# Patient Record
Sex: Female | Born: 1990 | Race: White | Hispanic: No | Marital: Single | Smoking: Never smoker
Health system: Southern US, Community
[De-identification: ages and names within clinical notes are randomized; demographics above are authoritative.]

## PROBLEM LIST (undated history)

## (undated) DIAGNOSIS — J45909 Unspecified asthma, uncomplicated: Secondary | ICD-10-CM

## (undated) DIAGNOSIS — N200 Calculus of kidney: Secondary | ICD-10-CM

## (undated) DIAGNOSIS — L509 Urticaria, unspecified: Secondary | ICD-10-CM

## (undated) DIAGNOSIS — K589 Irritable bowel syndrome without diarrhea: Secondary | ICD-10-CM

## (undated) DIAGNOSIS — G2581 Restless legs syndrome: Secondary | ICD-10-CM

## (undated) DIAGNOSIS — K219 Gastro-esophageal reflux disease without esophagitis: Secondary | ICD-10-CM

## (undated) DIAGNOSIS — Z87442 Personal history of urinary calculi: Secondary | ICD-10-CM

## (undated) DIAGNOSIS — E039 Hypothyroidism, unspecified: Secondary | ICD-10-CM

## (undated) DIAGNOSIS — J019 Acute sinusitis, unspecified: Secondary | ICD-10-CM

## (undated) DIAGNOSIS — R000 Tachycardia, unspecified: Secondary | ICD-10-CM

## (undated) HISTORY — PX: CHOLECYSTECTOMY: SHX55

## (undated) HISTORY — PX: TONSILLECTOMY: SUR1361

---
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
## 2011-05-19 ENCOUNTER — Ambulatory Visit: Payer: Self-pay

## 2011-10-26 ENCOUNTER — Observation Stay: Payer: Self-pay

## 2011-10-26 LAB — URINALYSIS, COMPLETE
Bacteria: NONE SEEN
Glucose,UR: NEGATIVE mg/dL (ref 0–75)
Ketone: NEGATIVE
Nitrite: NEGATIVE
Ph: 7 (ref 4.5–8.0)
RBC,UR: 2155 /HPF (ref 0–5)
Squamous Epithelial: 20
WBC UR: 10 /HPF (ref 0–5)

## 2011-11-28 ENCOUNTER — Observation Stay: Payer: Self-pay | Admitting: Obstetrics & Gynecology

## 2011-11-29 LAB — URINALYSIS, COMPLETE
Bacteria: NONE SEEN
Bilirubin,UR: NEGATIVE
Blood: NEGATIVE
Glucose,UR: NEGATIVE mg/dL (ref 0–75)
Ketone: NEGATIVE
Leukocyte Esterase: NEGATIVE
Nitrite: NEGATIVE
Ph: 6 (ref 4.5–8.0)
Protein: NEGATIVE
RBC,UR: NONE SEEN /HPF (ref 0–5)
Specific Gravity: 1.021 (ref 1.003–1.030)
Squamous Epithelial: 11
WBC UR: NONE SEEN /HPF (ref 0–5)

## 2012-02-01 ENCOUNTER — Observation Stay: Payer: Self-pay

## 2012-02-02 ENCOUNTER — Observation Stay: Payer: Self-pay

## 2012-02-09 ENCOUNTER — Observation Stay: Payer: Self-pay | Admitting: Obstetrics & Gynecology

## 2012-02-28 ENCOUNTER — Observation Stay: Payer: Self-pay | Admitting: Obstetrics and Gynecology

## 2012-03-09 ENCOUNTER — Inpatient Hospital Stay: Payer: Self-pay | Admitting: Obstetrics & Gynecology

## 2012-03-09 LAB — CBC WITH DIFFERENTIAL/PLATELET
Basophil #: 0 10*3/uL (ref 0.0–0.1)
Basophil %: 0.2 %
Eosinophil #: 0.1 10*3/uL (ref 0.0–0.7)
HGB: 9.3 g/dL — ABNORMAL LOW (ref 12.0–16.0)
MCH: 25.3 pg — ABNORMAL LOW (ref 26.0–34.0)
MCHC: 31.5 g/dL — ABNORMAL LOW (ref 32.0–36.0)
Neutrophil %: 68.3 %
Platelet: 190 10*3/uL (ref 150–440)
WBC: 8.4 10*3/uL (ref 3.6–11.0)

## 2012-07-13 ENCOUNTER — Emergency Department: Payer: Self-pay | Admitting: Emergency Medicine

## 2013-01-06 ENCOUNTER — Ambulatory Visit: Payer: Self-pay | Admitting: Family Medicine

## 2013-03-19 ENCOUNTER — Other Ambulatory Visit: Payer: Self-pay | Admitting: Gastroenterology

## 2013-03-19 LAB — CLOSTRIDIUM DIFFICILE BY PCR

## 2013-03-21 LAB — STOOL CULTURE

## 2014-05-30 ENCOUNTER — Ambulatory Visit: Payer: Self-pay | Admitting: Physician Assistant

## 2014-05-30 LAB — RAPID STREP-A WITH REFLX: Micro Text Report: NEGATIVE

## 2014-06-02 LAB — BETA STREP CULTURE(ARMC)

## 2014-11-20 NOTE — H&P (Signed)
L&D Evaluation:  History Expanded:   HPI 24 yo G1 at 25+ weeks presents with c/o right sided pain intermittantly for several weeks.  Improves at times w stretching.  Tylenol provides min help.  Denies contractions, LOF, VB, +FM. [facility]  [facility] for early [facility] to care.    Patient's Medical History No Chronic Illness    Patient's Surgical History none    Medications Pre Natal Vitamins    Allergies NKDA    Social History none    Family History Non-Contributory   Exam:   Vital Signs stable    General no apparent distress    Mental Status clear    Abdomen gravid, non-tender    Estimated Fetal Weight Average for gestational age    Back no CVAT    Edema no edema    FHT FHR 130s    Ucx absent    Skin dry   Impression:   Impression ABD PAIN, related to pregnancy, appears to be musculoskeletal in origin.   Plan:   Plan UA    Comments Analgesics.  Min narcotic use encouraged and allowed.  Percocet here helped, Rx for 12. F/U office.   Electronic Signatures: Letitia LibraHarris, Amaani Guilbault Paul (MD)  (Signed 19-May-13 08:00)  Authored: L&D Evaluation   Last Updated: 19-May-13 08:00 by Letitia LibraHarris, Nishtha Raider Paul (MD)

## 2014-11-20 NOTE — H&P (Signed)
L&D Evaluation:  History Expanded:   HPI Monitoring in office weekly due to recent vag bleeding.  Otherwise Fetal Well-being Reassuring throughoujt recent visits.  Today Non-Stress Test not reassuring so sent to Labor and Delivery. Good FM.  No complaints.No vaginal bleeding or ROM.  Currently 35 weeks.    Patient's Medical History No Chronic Illness    Patient's Surgical History none    Medications Pre Natal Vitamins    Allergies NKDA    Social History none    Family History Non-Contributory   ROS:   ROS All systems were reviewed.  HEENT, CNS, GI, GU, Respiratory, CV, Renal and Musculoskeletal systems were found to be normal.   Exam:   Vital Signs stable    General no apparent distress    Mental Status clear    Abdomen gravid, non-tender    Estimated Fetal Weight Average for gestational age    FHT normal rate with no decels, Non-Stress Test REACTIVE    Fetal Heart Rate 140    Ucx absent   Impression:   Impression Non-Stress Test R, Monitoring due to earlier nonreassuring Non-Stress Test.  No signs of labor.   Plan:   Plan EFM/NST    Comments F/U in office this week.   Electronic Signatures: Letitia LibraHarris, Reed Dady Paul (MD)  (Signed 30-Jul-13 22:38)  Authored: L&D Evaluation   Last Updated: 30-Jul-13 22:38 by Letitia LibraHarris, Kaysha Parsell Paul (MD)

## 2014-11-20 NOTE — H&P (Signed)
L&D Evaluation:  History Expanded:   HPI 24 yo G1 at 20 weeks 3 days presents with c/o left sided pain after eating last night. Denies contractions, LOF, VB, +FM. [facility]  [facility] for early [facility] to care, incomplete anatomy screen.    Patient's Medical History No Chronic Illness    Patient's Surgical History none    Medications Pre Natal Vitamins    Allergies NKDA    Social History none   Exam:   Vital Signs stable    General no apparent distress    Mental Status clear    Abdomen gravid, non-tender    Back CVAT, left side only    Edema no edema    FHT FHR    Ucx regular    Skin dry   Plan:   Plan UA    Comments UA: 3+ blood, 30 protein Renal US WNL. Pt feels much better after IV fluid bolus and 2 Percocet.  D/c home, will f/u with urine cx results prn.   Electronic Signatures: Jamear Carbonneau, Marta Lamasamara K (CNM)  (Signed 16-Apr-13 08:47)  Authored: L&D Evaluation   Last Updated: 16-Apr-13 08:47 by Vella KohlerBrothers, Nielle Duford K (CNM)

## 2016-06-07 ENCOUNTER — Ambulatory Visit: Payer: Medicaid Other

## 2016-06-07 ENCOUNTER — Encounter: Payer: Self-pay | Admitting: Emergency Medicine

## 2016-06-07 ENCOUNTER — Ambulatory Visit
Admission: EM | Admit: 2016-06-07 | Discharge: 2016-06-07 | Disposition: A | Discharge status: Home / Self Care | Payer: Medicaid Other | Attending: Emergency Medicine | Admitting: Emergency Medicine

## 2016-06-07 DIAGNOSIS — M25571 Pain in right ankle and joints of right foot: Secondary | ICD-10-CM | POA: Insufficient documentation

## 2016-06-07 DIAGNOSIS — R937 Abnormal findings on diagnostic imaging of other parts of musculoskeletal system: Secondary | ICD-10-CM | POA: Diagnosis not present

## 2016-06-07 DIAGNOSIS — S93601A Unspecified sprain of right foot, initial encounter: Secondary | ICD-10-CM

## 2016-06-07 DIAGNOSIS — M79671 Pain in right foot: Secondary | ICD-10-CM | POA: Diagnosis present

## 2016-06-07 HISTORY — DX: Urticaria, unspecified: L50.9

## 2016-06-07 HISTORY — DX: Acute sinusitis, unspecified: J01.90

## 2016-06-07 HISTORY — DX: Irritable bowel syndrome, unspecified: K58.9

## 2016-06-07 MED ORDER — NAPROXEN 500 MG PO TABS: 500.0000 mg | ORAL_TABLET | Freq: Two times a day (BID) | ORAL | 0 refills | Status: DC

## 2016-06-07 NOTE — ED Provider Notes (Signed)
CSN: [id number]     Arrival date & time 06/07/16  1221 History   First MD Initiated Contact with Patient 06/07/16 1420     Chief Complaint  Patient presents with  . Foot Pain   (Consider location/radiation/quality/duration/timing/severity/associated sxs/prior Treatment) HPI  Is a 25 year old female who presents with right foot pain that is lasted about 4 days. She was standing from a seated position when she felt a pop over her  lateral aspect of the right ankle and foot along with the pain. Since that time ,she is been able to walk but is very painful and she is limping badly.Pain is mostly  on the lateral aspect the ankle and over the dorsum of her foot. There is swelling present and some redness but no erythema. She states any dorsiflexion seems to bother her the most.Other than when she stood andheard the pop she does not remember any specific injury prior to that and has not changed her activities. Been using ibuprofen 800 mg without any relief of her pain.      Past Medical History:  Diagnosis Date  . Acute sinusitis   . Hives   . IBS (irritable bowel syndrome)    Past Surgical History:  Procedure Laterality Date  . CHOLECYSTECTOMY    . TONSILLECTOMY     Family History  Problem Relation Age of Onset  . Healthy Mother   . Healthy Father    Social History  Substance Use Topics  . Smoking status: Never Smoker  . Smokeless tobacco: Never Used  . Alcohol use Not on file   OB History    Gravida Para Term Preterm AB Living   1             SAB TAB Ectopic Multiple Live Births                 Review of Systems  Constitutional: Positive for activity change. Negative for chills, fatigue and fever.  Musculoskeletal: Positive for arthralgias and gait problem.  All other systems reviewed and are negative.   Allergies  Patient has no known allergies.  Home Medications   Prior to Admission medications   Medication Sig Start Date End Date Taking? Authorizing Provider   cetirizine (ZYRTEC) 10 MG tablet Take 10 mg by mouth daily.   Yes Historical Provider, MD  famotidine (PEPCID) 40 MG tablet Take 40 mg by mouth daily.   Yes Historical Provider, MD  ibuprofen (ADVIL,MOTRIN) 800 MG tablet Take 800 mg by mouth every 8 (eight) hours as needed.   Yes Historical Provider, MD  norethindrone-ethinyl estradiol (JUNEL FE,GILDESS FE,LOESTRIN FE) 1-20 MG-MCG tablet Take 1 tablet by mouth daily.   Yes Historical Provider, MD  naproxen (NAPROSYN) 500 MG tablet Take 1 tablet (500 mg total) by mouth 2 (two) times daily with a meal. 06/07/16   Lutricia Feil, PA-C   Meds Ordered and Administered this Visit  Medications - No data to display  Ht 5\' 4"  (1.626 m)   Wt 200 lb (90.7 kg)   LMP 06/05/2016 (Approximate)   BMI 34.33 kg/m  No data found.   Physical Exam  Constitutional: She is oriented to person, place, and time. She appears well-developed and well-nourished. No distress.  HENT:  Head: Normocephalic and atraumatic.  Eyes: EOM are normal. Pupils are equal, round, and reactive to light.  Neck: Normal range of motion. Neck supple.  Musculoskeletal: She exhibits edema and tenderness.  Examination of the right ankle and foot shows some swelling over  the dorsum of foot and also over the lateral malleolar area. Patient is reluctant to dorsiflex or plantar flex due to pain but subtalar motion is intact. There is no pain with compression of the tibia fibula in the syndesmotic area. Is no tenderness over the medial malleolus or along the deltoid ligament. There is tenderness over the anterior fibilo talar ligament area and also the proximal foot from the fourth and third metatarsals. She's also has tenderness distally over the distal metatarsals at the metatarsal necks of 5 through 2.  Neurological: She is alert and oriented to person, place, and time.  Skin: Skin is warm and dry. She is not diaphoretic.  Psychiatric: She has a normal mood and affect. Her behavior is  normal. Judgment and thought content normal.  Nursing note and vitals reviewed.   Urgent Care Course   Clinical Course     Procedures (including critical care time)  Labs Review Labs Reviewed - No data to display  Imaging Review Dg Ankle Complete Right  Result Date: 06/07/2016 CLINICAL DATA:  25 year old female with acute onset foot an ankle pain 4 days ago. No specific injury. Initial encounter. EXAM: RIGHT ANKLE - COMPLETE 3+ VIEW COMPARISON:  None. FINDINGS: Bone mineralization is within normal limits. Normal mortise joint alignment. Talar dome intact. No joint effusion. Distal tibia and fibula appear normal. Calcaneus is intact. Negative visualized right foot. IMPRESSION: Normal radiographic appearance of the right ankle. Electronically Signed   By: Odessa FlemingH  Hall M.D.   On: 06/07/2016 14:52   Dg Foot Complete Right  Result Date: 06/07/2016 CLINICAL DATA:  25 year old female with acute onset foot an ankle pain 4 days ago. No specific injury. Initial encounter. EXAM: RIGHT FOOT COMPLETE - 3+ VIEW COMPARISON:  Right ankle series from today reported separately. FINDINGS: Bone mineralization is within normal limits. Joint spaces and alignment are normal. Right foot osseous structures are intact. No acute osseous abnormality identified. Suggestion of mild generalized soft tissue swelling. No subcutaneous gas. IMPRESSION: Mild generalized soft tissue swelling suspected. No osseous abnormality identified in the right foot. Electronically Signed   By: Odessa FlemingH  Hall M.D.   On: 06/07/2016 14:53     Visual Acuity Review  Right Eye Distance:   Left Eye Distance:   Bilateral Distance:    Right Eye Near:   Left Eye Near:    Bilateral Near:         MDM   1. Sprain of right foot, initial encounter    New Prescriptions   NAPROXEN (NAPROSYN) 500 MG TABLET    Take 1 tablet (500 mg total) by mouth 2 (two) times daily with a meal.  Plan: 1. Test/x-ray results and diagnosis reviewed with patient 2.  rx as per orders; risks, benefits, potential side effects reviewed with patient 3. Recommend supportive treatment with Elevation sufficient to control swelling and ice 20 minutes out of every 2 hours for pain and swelling. Patient was given a boot orthosis to help facilitate quicker recovery her to walk without crutches. May remove the polypoid quiet times to allow her skin to rest. If she is not improving she should follow-up with her primary care physician or podiatrist. She should not take her ibuprofen while she is taking Naprosyn. I've cautioned her always take the anti-inflammatory medications with food and to consider using Prevacid or Prilosec for additional gastric protection 4. F/u prn if symptoms worsen or don't improve     Lutricia FeilWilliam P Shenay Torti, PA-C 06/07/16 1517    Lutricia FeilWilliam P Yared Barefoot,  PA-C 06/07/16 1520

## 2016-06-07 NOTE — ED Triage Notes (Signed)
Per patient c/o right foot pain. Patient stated swelling and burning at right foot. Per patient no injury to her right foot.

## 2016-10-07 ENCOUNTER — Ambulatory Visit (INDEPENDENT_AMBULATORY_CARE_PROVIDER_SITE_OTHER): Payer: Medicaid Other | Admitting: Podiatry

## 2016-10-07 ENCOUNTER — Ambulatory Visit (INDEPENDENT_AMBULATORY_CARE_PROVIDER_SITE_OTHER): Payer: Medicaid Other

## 2016-10-07 VITALS — BP 119/94 | HR 113 | Resp 16

## 2016-10-07 DIAGNOSIS — S93621A Sprain of tarsometatarsal ligament of right foot, initial encounter: Secondary | ICD-10-CM | POA: Diagnosis not present

## 2016-10-07 DIAGNOSIS — S93601A Unspecified sprain of right foot, initial encounter: Secondary | ICD-10-CM

## 2016-10-07 DIAGNOSIS — M79671 Pain in right foot: Secondary | ICD-10-CM

## 2016-10-08 NOTE — Progress Notes (Signed)
Brittany Stevens presents today with chief complaint of pain to the right foot 4 months she reports no injury but she states she stepped down 4 months ago and felt a pop in the lateral aspect of her foot. She went to urgent care and was treated for sprain she states that the foot continues to her hip pops when she walks and appears to be flattening. She's been treated with a cam boot and naproxen to no avail. She states the boot did help some.  Objective: Vital signs are stable she is alert and oriented 3. Pulses are palpable. Neurologic sensorium is intact. Deep tendon reflexes are intact. Muscle strength +5 over 5 dorsiflexors plantar flexors and inverters and everters all intrinsic musculature is intact. Orthopedic evaluation and straight solid joints distal to the ankle level full range of motion without crepitation. She does have pain on from playing range of motion of Lisfranc's joints. She also has been on palpation of the fourth fifth metatarsocuboid articulation of the dorsal ridge noted. This is ask almost like a dislocation. She also has tenderness on and hypermobility at the level of the second metatarsal intermediate cuneiform joint. Radiographs demonstrate gapping between the first and second metatarsals and the medial and intermediate cuneiforms. This appears to be a Lisfranc's injury.  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  Plan: At this point fill that since therapy has not provided her with relief wearing a Cam Walker and proximal fill MRI will be necessary. I will follow up with her once his results right.

## 2016-10-14 ENCOUNTER — Telehealth: Payer: Self-pay

## 2016-10-14 NOTE — Telephone Encounter (Signed)
MRI has been approved per [facility])  Auth # [id number]  Expires 11/07/16

## 2016-10-22 ENCOUNTER — Ambulatory Visit
Admission: RE | Admit: 2016-10-22 | Discharge: 2016-10-22 | Disposition: A | Discharge status: Home / Self Care | Payer: Medicaid Other | Source: Ambulatory Visit | Attending: Podiatry | Admitting: Podiatry

## 2016-10-22 ENCOUNTER — Telehealth: Payer: Self-pay | Admitting: *Deleted

## 2016-10-22 DIAGNOSIS — S93621A Sprain of tarsometatarsal ligament of right foot, initial encounter: Secondary | ICD-10-CM

## 2016-10-22 DIAGNOSIS — M79671 Pain in right foot: Secondary | ICD-10-CM | POA: Diagnosis not present

## 2016-10-22 NOTE — Telephone Encounter (Addendum)
-----   Message from Elinor Parkinson, [place] sent at 10/22/2016  5:01 PM EDT ----- Send for an over read and inform patient of delay. I informed pt of Dr. Geryl Rankins orders and explained the delay and process of Overread service.10/27/2016-Mailed copy of MRI disc to [facility].

## 2016-11-03 ENCOUNTER — Encounter: Payer: Self-pay | Admitting: Podiatry

## 2016-11-11 ENCOUNTER — Ambulatory Visit (INDEPENDENT_AMBULATORY_CARE_PROVIDER_SITE_OTHER): Payer: Medicaid Other | Admitting: Podiatry

## 2016-11-11 DIAGNOSIS — S93601D Unspecified sprain of right foot, subsequent encounter: Secondary | ICD-10-CM | POA: Diagnosis not present

## 2016-11-11 DIAGNOSIS — S93621D Sprain of tarsometatarsal ligament of right foot, subsequent encounter: Secondary | ICD-10-CM

## 2016-11-12 NOTE — Progress Notes (Signed)
She presents today for follow-up of right foot pain. She had an MRI performed recently to confirm that there was no fracture or dislocation of Lisfranc joints. She states that seems to be doing better.  Objective: Vital signs are stable she is alert and oriented 3. MRI does indicate a sprain of the ligaments surrounding some of the joints and Lisfranc. Particularly overlying the third metatarsal cuneiform region.  Assessment: Sprained right foot.  Plan: Follow up with us as needed. Instructed her that she's not improved in 6384 months to call us.

## 2017-01-06 ENCOUNTER — Encounter: Payer: Self-pay | Admitting: Emergency Medicine

## 2017-01-06 DIAGNOSIS — Z79899 Other long term (current) drug therapy: Secondary | ICD-10-CM | POA: Diagnosis not present

## 2017-01-06 DIAGNOSIS — R112 Nausea with vomiting, unspecified: Secondary | ICD-10-CM | POA: Insufficient documentation

## 2017-01-06 DIAGNOSIS — R109 Unspecified abdominal pain: Secondary | ICD-10-CM | POA: Diagnosis present

## 2017-01-06 DIAGNOSIS — R1013 Epigastric pain: Secondary | ICD-10-CM | POA: Insufficient documentation

## 2017-01-06 LAB — URINALYSIS, COMPLETE (UACMP) WITH MICROSCOPIC
BILIRUBIN URINE: NEGATIVE
Glucose, UA: NEGATIVE mg/dL
HGB URINE DIPSTICK: NEGATIVE
KETONES UR: NEGATIVE mg/dL
Nitrite: NEGATIVE
PROTEIN: 30 mg/dL — AB
Specific Gravity, Urine: 1.027 (ref 1.005–1.030)
pH: 5 (ref 5.0–8.0)

## 2017-01-06 LAB — COMPREHENSIVE METABOLIC PANEL
ALBUMIN: 3.9 g/dL (ref 3.5–5.0)
ALT: 13 U/L — ABNORMAL LOW (ref 14–54)
ANION GAP: 6 (ref 5–15)
AST: 17 U/L (ref 15–41)
Alkaline Phosphatase: 85 U/L (ref 38–126)
BUN: 10 mg/dL (ref 6–20)
CHLORIDE: 109 mmol/L (ref 101–111)
CO2: 23 mmol/L (ref 22–32)
Calcium: 9 mg/dL (ref 8.9–10.3)
Creatinine, Ser: 0.97 mg/dL (ref 0.44–1.00)
GFR calc Af Amer: >60 mL/min (ref >60)
Glucose, Bld: 123 mg/dL — ABNORMAL HIGH (ref 65–99)
POTASSIUM: 4 mmol/L (ref 3.5–5.1)
Sodium: 138 mmol/L (ref 135–145)
Total Bilirubin: 0.3 mg/dL (ref 0.3–1.2)
Total Protein: 8.2 g/dL — ABNORMAL HIGH (ref 6.5–8.1)

## 2017-01-06 LAB — CBC
HEMATOCRIT: 41.2 % (ref 35.0–47.0)
HEMOGLOBIN: 13.9 g/dL (ref 12.0–16.0)
MCH: 27.5 pg (ref 26.0–34.0)
MCHC: 33.7 g/dL (ref 32.0–36.0)
MCV: 81.7 fL (ref 80.0–100.0)
Platelets: 360 10*3/uL (ref 150–440)
RBC: 5.04 MIL/uL (ref 3.80–5.20)
RDW: 15.5 % — ABNORMAL HIGH (ref 11.5–14.5)
WBC: 15.4 10*3/uL — AB (ref 3.6–11.0)

## 2017-01-06 LAB — LIPASE, BLOOD: LIPASE: 36 U/L (ref 11–51)

## 2017-01-06 NOTE — ED Triage Notes (Signed)
Patient ambulatory to triage with steady gait, without difficulty or distress noted; pt reports mid abd pain with N/V for last 6 hrs; st hx pancreatitis

## 2017-01-07 ENCOUNTER — Emergency Department
Admission: EM | Admit: 2017-01-07 | Discharge: 2017-01-07 | Disposition: A | Discharge status: Home / Self Care | Payer: Medicaid Other | Attending: Emergency Medicine | Admitting: Emergency Medicine

## 2017-01-07 DIAGNOSIS — R1013 Epigastric pain: Secondary | ICD-10-CM

## 2017-01-07 DIAGNOSIS — N390 Urinary tract infection, site not specified: Secondary | ICD-10-CM

## 2017-01-07 DIAGNOSIS — R112 Nausea with vomiting, unspecified: Secondary | ICD-10-CM

## 2017-01-07 LAB — POCT PREGNANCY, URINE: PREG TEST UR: NEGATIVE

## 2017-01-07 LAB — PREGNANCY, URINE: Preg Test, Ur: NEGATIVE

## 2017-01-07 MED ORDER — DICYCLOMINE HCL 20 MG PO TABS: 20.0000 mg | ORAL_TABLET | Freq: Four times a day (QID) | ORAL | 0 refills | Status: DC | PRN

## 2017-01-07 MED ORDER — ONDANSETRON HCL 4 MG/2ML IJ SOLN
4.0000 mg | Freq: Once | INTRAMUSCULAR | Status: AC
  Administered 2017-01-07: 4 mg via INTRAVENOUS
  Filled 2017-01-07: qty 2

## 2017-01-07 MED ORDER — ONDANSETRON 4 MG PO TBDP: 4.0000 mg | ORAL_TABLET | Freq: Three times a day (TID) | ORAL | 0 refills | Status: AC | PRN

## 2017-01-07 MED ORDER — DEXTROSE 5 % IV SOLN
1.0000 g | INTRAVENOUS | Status: DC
  Administered 2017-01-07: 03:00:00 via INTRAVENOUS
  Filled 2017-01-07: qty 10

## 2017-01-07 MED ORDER — MORPHINE SULFATE (PF) 4 MG/ML IV SOLN
4.0000 mg | Freq: Once | INTRAVENOUS | Status: AC
  Administered 2017-01-07: 4 mg via INTRAVENOUS
  Filled 2017-01-07: qty 1

## 2017-01-07 MED ORDER — CEPHALEXIN 500 MG PO CAPS: 500.0000 mg | ORAL_CAPSULE | Freq: Three times a day (TID) | ORAL | 0 refills | Status: DC

## 2017-01-07 MED ORDER — FAMOTIDINE IN NACL 20-0.9 MG/50ML-% IV SOLN
20.0000 mg | Freq: Once | INTRAVENOUS | Status: AC
  Administered 2017-01-07: 20 mg via INTRAVENOUS
  Filled 2017-01-07: qty 50

## 2017-01-07 MED ORDER — SODIUM CHLORIDE 0.9 % IV BOLUS (SEPSIS)
1000.0000 mL | Freq: Once | INTRAVENOUS | Status: AC
  Administered 2017-01-07: 1000 mL via INTRAVENOUS

## 2017-01-07 NOTE — ED Provider Notes (Signed)
[facility] Emergency Department Provider Note   ____________________________________________   First MD Initiated Contact with Patient 01/07/17 0300     (approximate)  I have reviewed the triage vital signs and the nursing notes.   HISTORY  Chief Complaint Abdominal Pain    HPI Brittany Stevens is a 26 y.o. female who presents to the ED from home with chief complaint of abdominal pain, nausea and vomiting. Reports a history of pancreatitis status post cholecystectomy when she was 26 years old. States onset of epigastric abdominal pain approximately 4 PM after eating an egg sandwich. She was able to eat dinner but began to vomit 3. Denies associated fever, chills, chest pain, shortness of breath, diarrhea. Denies recent travel, camping, antibiotic use or trauma. Nothing makes her symptoms better or worse.   Past Medical History:  Diagnosis Date  . Acute sinusitis   . Hives   . IBS (irritable bowel syndrome)     There are no active problems to display for this patient.   Past Surgical History:  Procedure Laterality Date  . CHOLECYSTECTOMY    . TONSILLECTOMY      Prior to Admission medications   Medication Sig Start Date End Date Taking? Authorizing Provider  cetirizine (ZYRTEC) 10 MG tablet Take 10 mg by mouth daily.    [provider]  famotidine (PEPCID) 40 MG tablet Take 40 mg by mouth daily.    [provider]  naproxen (NAPROSYN) 500 MG tablet Take 1 tablet (500 mg total) by mouth 2 (two) times daily with a meal. 06/07/16   Lutricia Feiloemer, William P, PA-C    Allergies Patient has no known allergies.  Family History  Problem Relation Age of Onset  . Healthy Mother   . Healthy Father     Social History Social History  Substance Use Topics  . Smoking status: Never Smoker  . Smokeless tobacco: Never Used  . Alcohol use Not on file    Review of Systems  Constitutional: No fever/chills. Eyes: No visual changes. ENT:  No sore throat. Cardiovascular: Denies chest pain. Respiratory: Denies shortness of breath. Gastrointestinal: Positive for abdominal pain.  Positive for nausea and vomiting.  No diarrhea.  No constipation. Genitourinary: Negative for dysuria. Musculoskeletal: Negative for back pain. Skin: Negative for rash. Neurological: Negative for headaches, focal weakness or numbness.   ____________________________________________   PHYSICAL EXAM:  VITAL SIGNS: ED Triage Vitals  Enc Vitals Group     BP 01/06/17 2300 (!) 134/93     Pulse Rate 01/06/17 2300 100     Resp 01/06/17 2300 (!) 22     Temp 01/06/17 2300 97.9 F (36.6 C)     Temp Source 01/06/17 2300 Oral     SpO2 01/06/17 2300 100 %     Weight 01/06/17 2258 200 lb (90.7 kg)     Height 01/06/17 2258 5\' 4"  (1.626 m)     Head Circumference --      Peak Flow --      Pain Score 01/06/17 2257 10     Pain Loc --      Pain Edu? --      Excl. in GC? --     Constitutional: Alert and oriented. Well appearing and in no acute distress. Eyes: Conjunctivae are normal. PERRL. EOMI. Head: Atraumatic. Nose: No congestion/rhinnorhea. Mouth/Throat: Mucous membranes are mildly dry.  Oropharynx non-erythematous. Neck: No stridor.   Cardiovascular: Tachycardic rate, regular rhythm. Grossly normal heart sounds.  Good peripheral circulation. Respiratory: Normal  respiratory effort.  No retractions. Lungs CTAB. Gastrointestinal: Soft and mildly tender to palpation epigastrium without rebound or guarding. No distention. No abdominal bruits. No CVA tenderness. Musculoskeletal: No lower extremity tenderness nor edema.  No joint effusions. Neurologic:  Normal speech and language. No gross focal neurologic deficits are appreciated. No gait instability. Skin:  Skin is warm, dry and intact. No rash noted. Psychiatric: Mood and affect are normal. Speech and behavior are normal.  ____________________________________________   LABS (all labs ordered are  listed, but only abnormal results are displayed)  Labs Reviewed  COMPREHENSIVE METABOLIC PANEL - Abnormal; Notable for the following:       Result Value   Glucose, Bld 123 (*)    Total Protein 8.2 (*)    ALT 13 (*)    All other components within normal limits  CBC - Abnormal; Notable for the following:    WBC 15.4 (*)    RDW 15.5 (*)    All other components within normal limits  URINALYSIS, COMPLETE (UACMP) WITH MICROSCOPIC - Abnormal; Notable for the following:    Color, Urine YELLOW (*)    APPearance CLOUDY (*)    Protein, ur 30 (*)    Leukocytes, UA TRACE (*)    Bacteria, UA RARE (*)    Squamous Epithelial / LPF TOO NUMEROUS TO COUNT (*)    All other components within normal limits  LIPASE, BLOOD  PREGNANCY, URINE  POC URINE PREG, ED  POCT PREGNANCY, URINE   ____________________________________________  EKG  None ____________________________________________  RADIOLOGY  No results found.  ____________________________________________   PROCEDURES  Procedure(s) performed: None  Procedures  Critical Care performed: No  ____________________________________________   INITIAL IMPRESSION / ASSESSMENT AND PLAN / ED COURSE  Pertinent labs & imaging results that were available during my care of the patient were reviewed by me and considered in my medical decision making (see chart for details).  26 year old female who presents with epigastric pain, nausea and vomiting. Laboratory urinalysis results remarkable for mild leukocytosis, mild UTI. Patient tachycardic in the 110s. Will administer IV fluid resuscitation, analgesia, Pepcid and reassess. Rocephin for UTI.  Clinical Course as of Jan 07 517  Thu Jan 07, 2017  0426 Patient feeling much better. Tolerated ice chips without emesis. Heart rate 95. Return precautions given. Patient and boyfriend verbalize understanding and agree with plan of care.  [JS]    Clinical Course User Index [JS] Irean Hong, MD      ____________________________________________   FINAL CLINICAL IMPRESSION(S) / ED DIAGNOSES  Final diagnoses:  Epigastric pain  Nausea and vomiting, intractability of vomiting not specified, unspecified vomiting type      NEW MEDICATIONS STARTED DURING THIS VISIT:  New Prescriptions   No medications on file     Note:  This document was prepared using Dragon voice recognition software and may include unintentional dictation errors.    Irean Hong, MD 01/07/17 [phone]

## 2017-01-07 NOTE — Discharge Instructions (Signed)
1. Take antibiotic as prescribed (Keflex 500 mg 3 times daily 7 days). 2. You may take medicines as needed for abdominal discomfort and nausea (Bentyl/Zofran #20). 3. Clear liquids 12 hours, then bland diet 3 days, then slowly advance diet as tolerated. 4. Return to the ER for worsening symptoms, persistent vomiting, difficult breathing or other concerns.

## 2017-10-30 IMAGING — CR DG FOOT COMPLETE 3+V*R*
3 series · 3 of 3 positions shown · non-contrast
Comparison: Right ankle series from today reported separately.

CLINICAL DATA: 25-year-old female with acute onset foot an ankle
pain 4 days ago. No specific injury. Initial encounter.

EXAM:
RIGHT FOOT COMPLETE - 3+ VIEW

[foot ap]
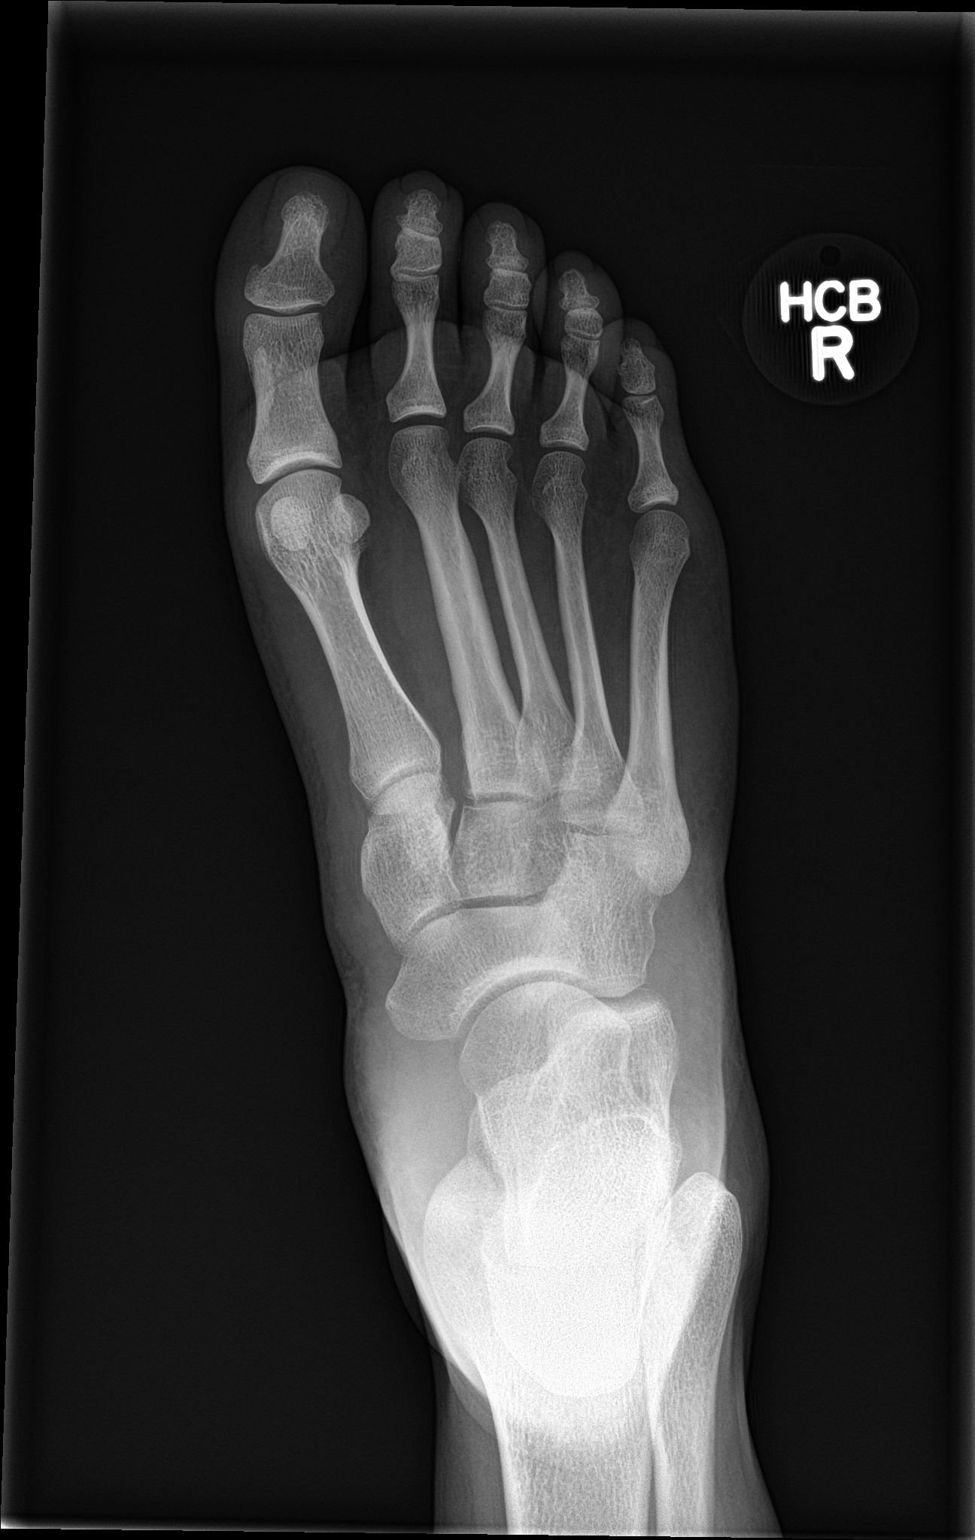

[foot obl]
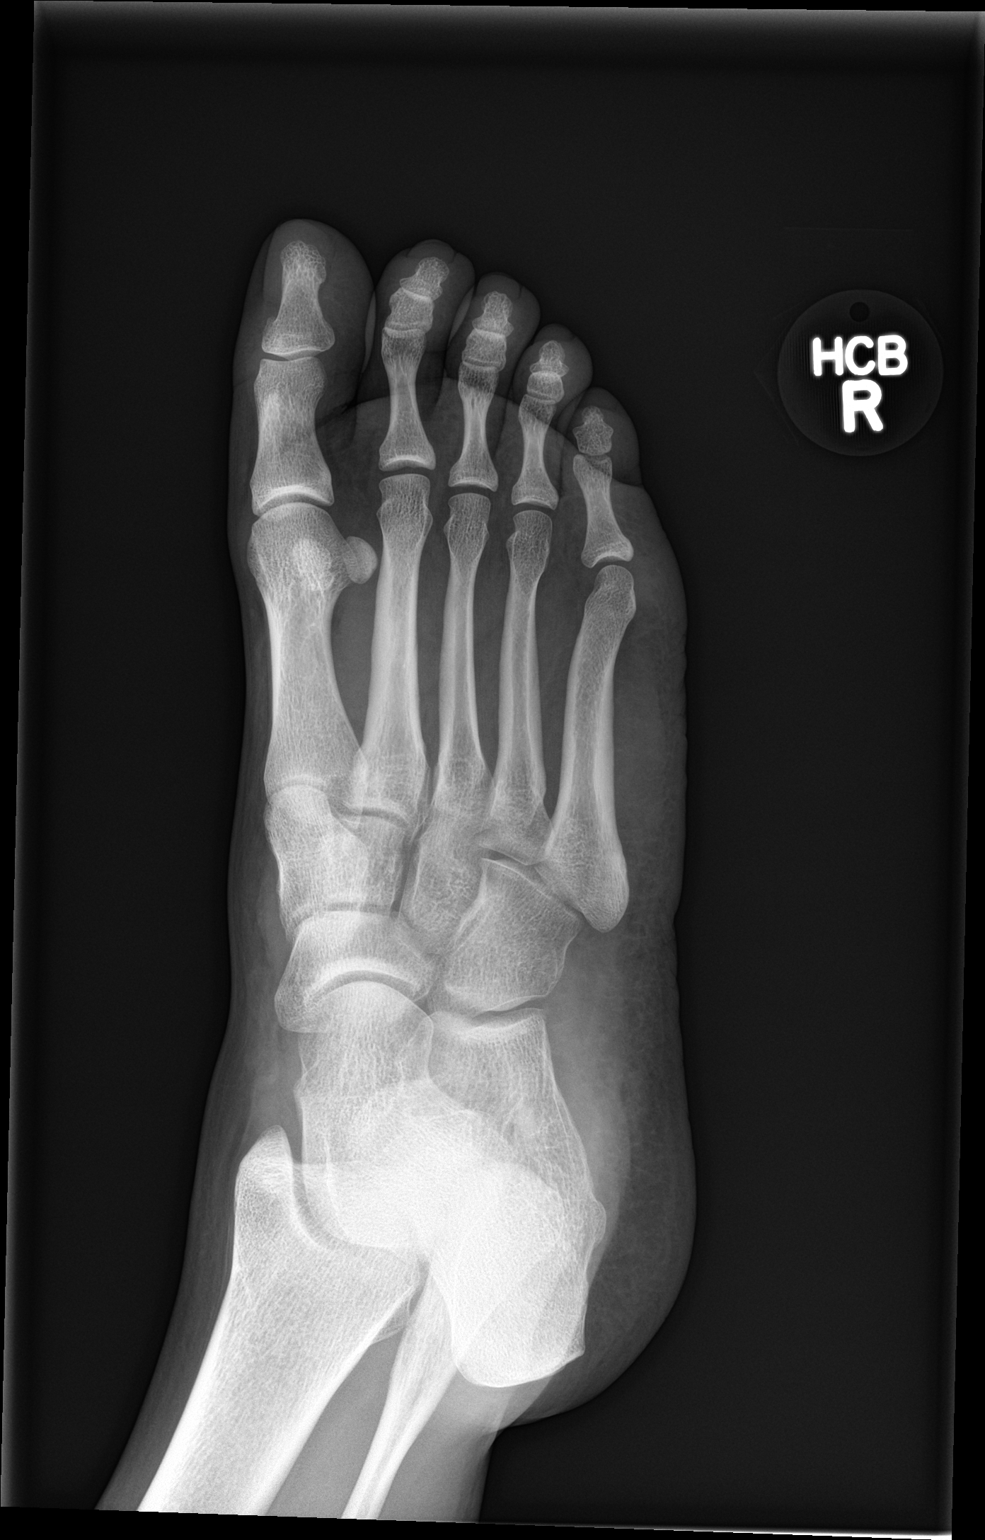

[foot lat]
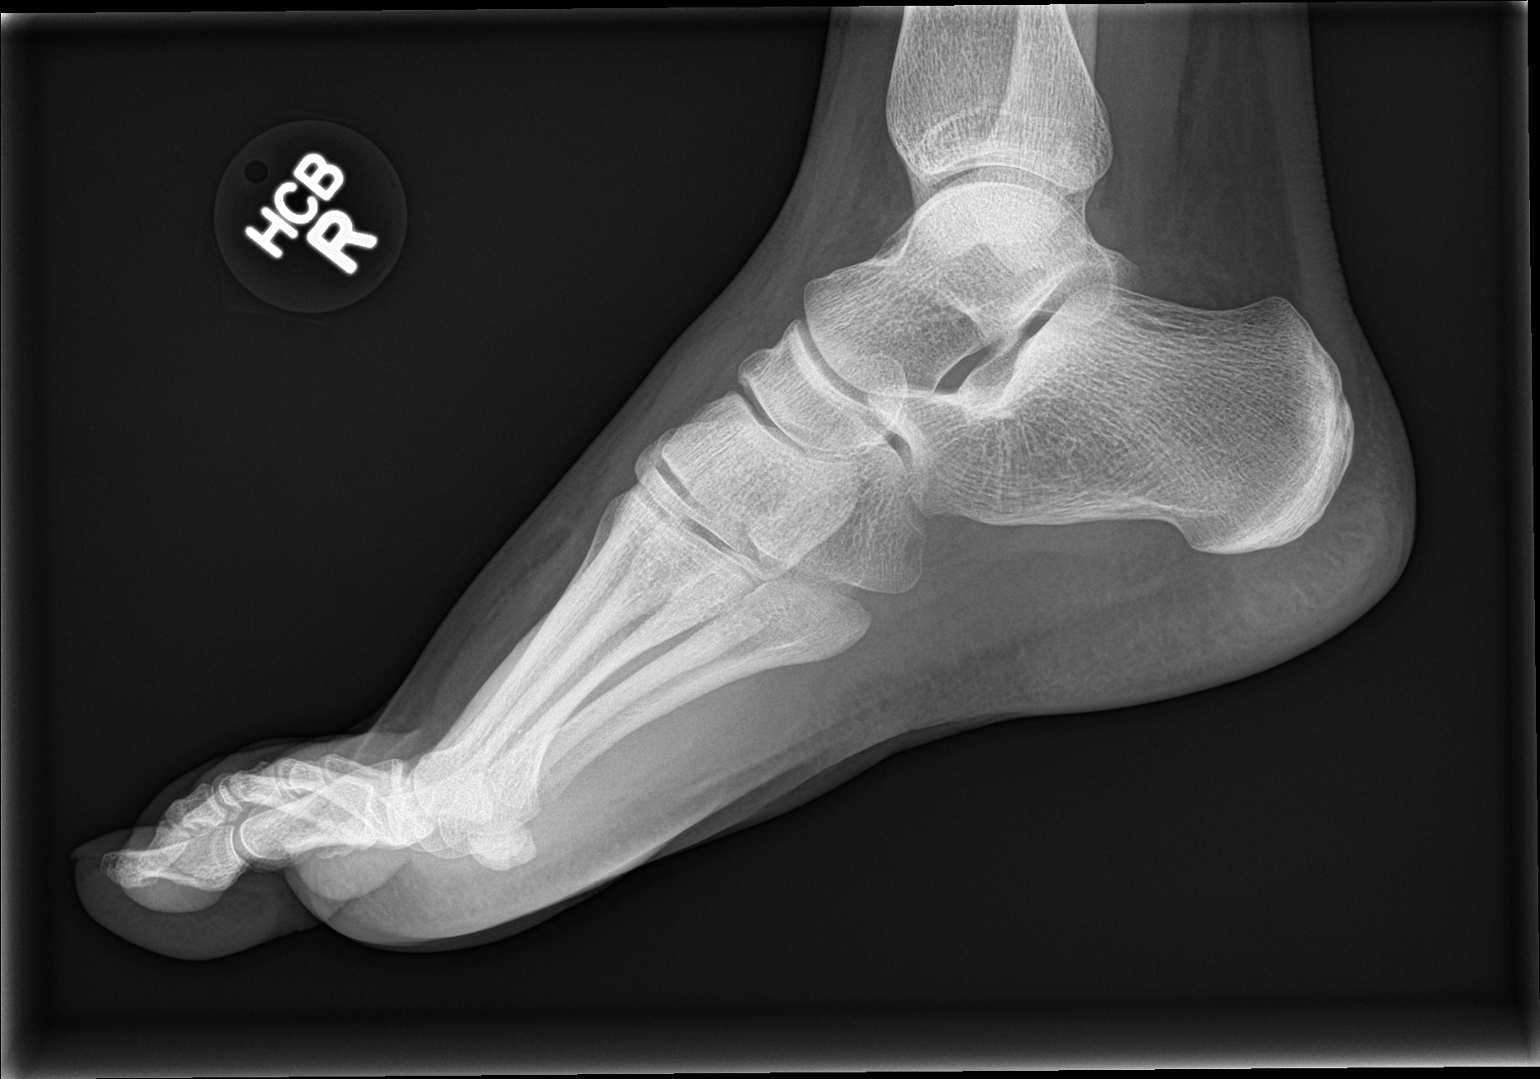

[3 of 3 positions shown; findings below may reference images not displayed]

FINDINGS: Bone mineralization is within normal limits. Joint spaces and
alignment are normal. Right foot osseous structures are intact. No
acute osseous abnormality identified. Suggestion of mild generalized
soft tissue swelling. No subcutaneous gas.
IMPRESSION: Mild generalized soft tissue swelling suspected. No osseous
abnormality identified in the right foot.

## 2018-03-16 IMAGING — MR MR FOOT*R* W/O CM
5 series · 40 of 40 positions shown · non-contrast
Comparison: Plain films right foot 06/07/2016 and 10/07/2016.

CLINICAL DATA: Right foot pain for over 4 months since the patient
felt a pop in the foot while stepping down. No specific injury.
Subsequent encounter.

EXAM:
MRI OF THE RIGHT FOREFOOT WITHOUT CONTRAST
TECHNIQUE: Multiplanar, multisequence MR imaging of the right foot was
performed. No intravenous contrast was administered.

[Series 4: T1 · coronal · 3.0mm · 0.53mm/px · 11 of 56 slices shown (1 of 2)]
[im 1/56]
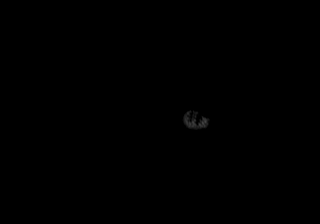
[im 6/56]
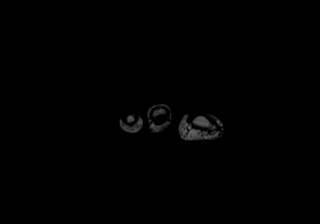
[im 12/56]
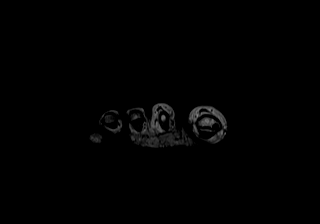
[im 17/56]
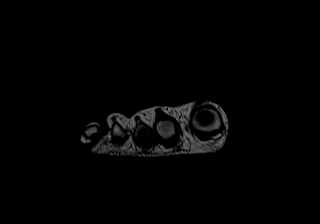
[im 23/56]
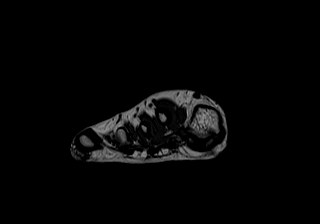
[im 28/56]
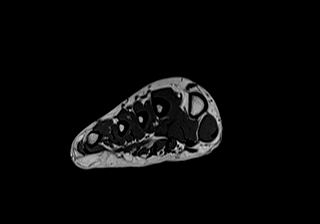
[im 34/56]
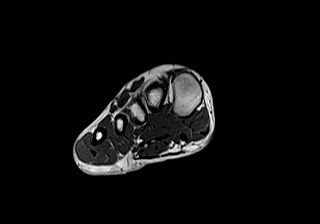
[im 39/56]
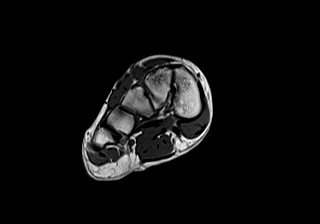
[im 45/56]
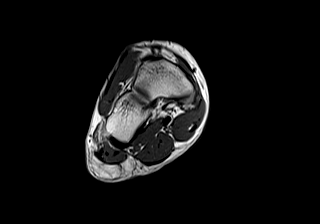
[im 50/56]
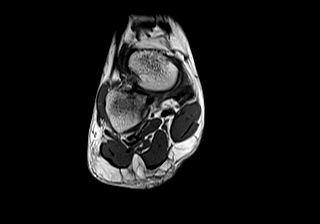
[im 56/56]
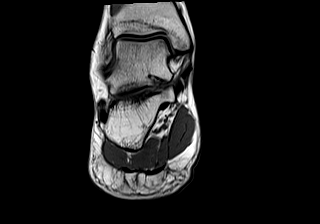

[Series 5: T2 fat-sat · coronal · 3.0mm · 0.53mm/px · 11 of 56 slices shown (1 of 3)]
[im 1/56]
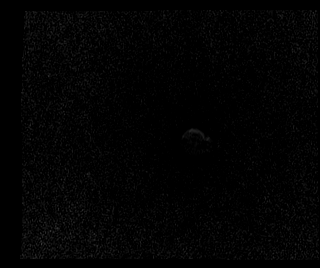
[im 6/56]
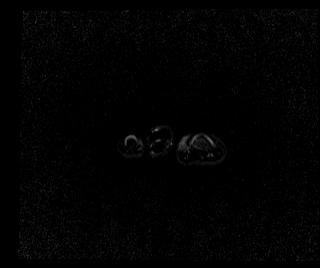
[im 12/56]
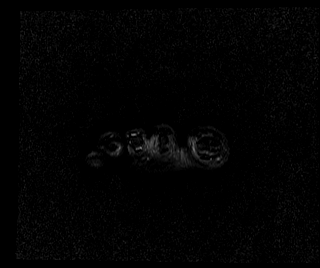
[im 17/56]
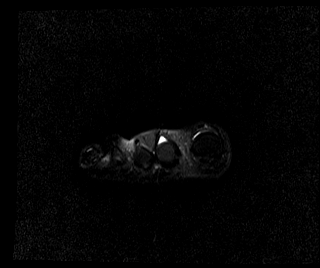
[im 23/56]
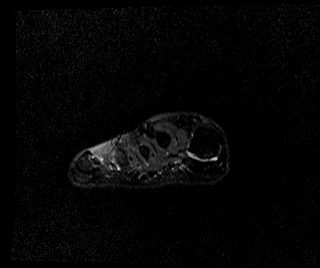
[im 28/56]
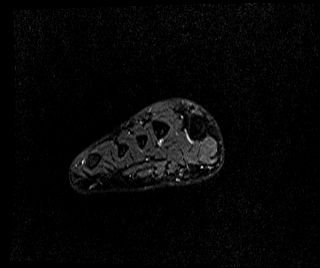
[im 34/56]
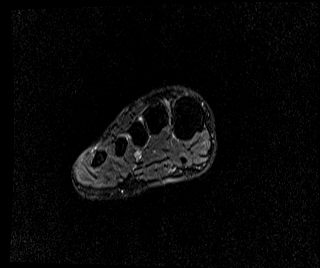
[im 39/56]
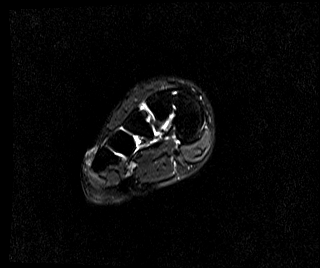
[im 45/56]
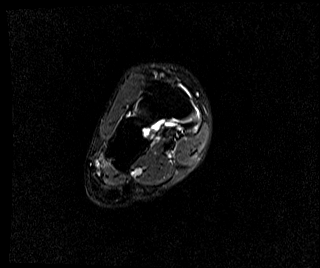
[im 50/56]
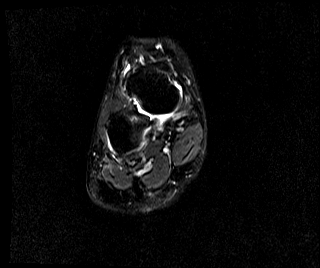
[im 56/56]
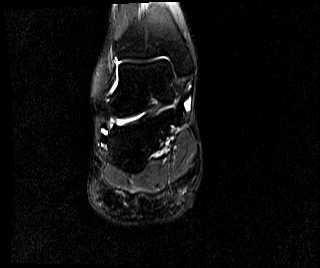

[Series 6: T1 · axial · 3.0mm · 0.62mm/px · z∈[-146,-60]mm · 6 of 28 slices shown (2 of 2)]
[im 1/28]
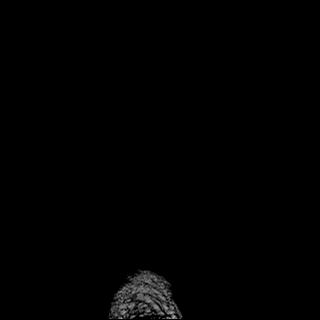
[im 6/28]
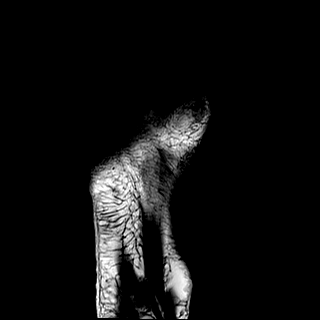
[im 11/28]
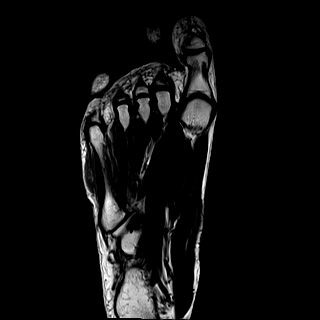
[im 17/28]
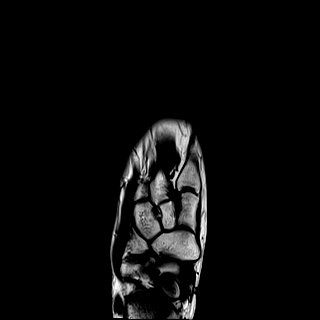
[im 22/28]
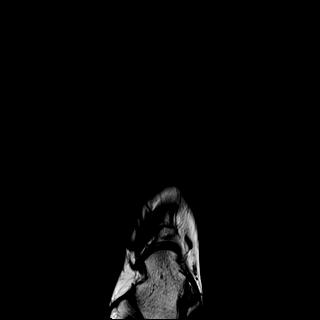
[im 28/28]
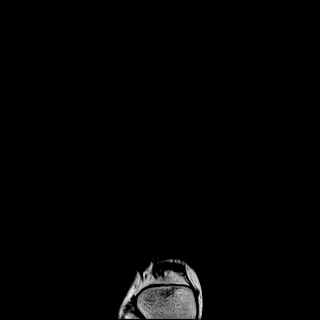

[Series 7: T2 fat-sat · axial · 3.0mm · 0.78mm/px · z∈[-146,-60]mm · 6 of 28 slices shown (2 of 3)]
[im 1/28]
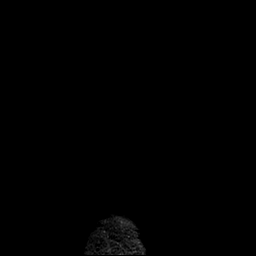
[im 6/28]
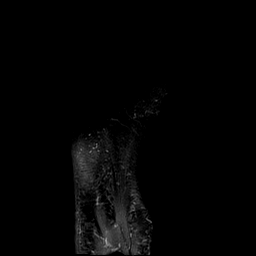
[im 11/28]
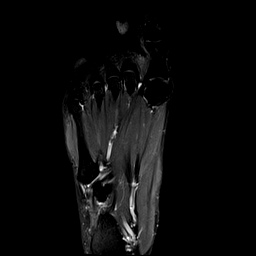
[im 17/28]
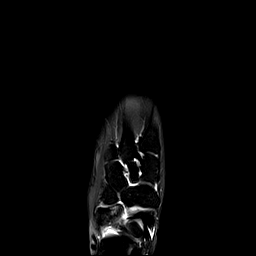
[im 22/28]
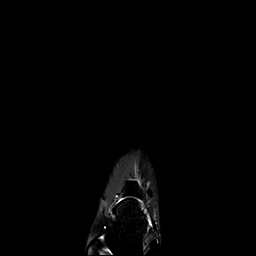
[im 28/28]
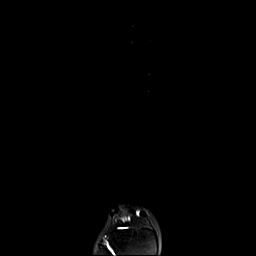

[Series 8: T2 fat-sat · sagittal · 3.0mm · 0.39mm/px · 6 of 28 slices shown (3 of 3)]
[im 1/28]
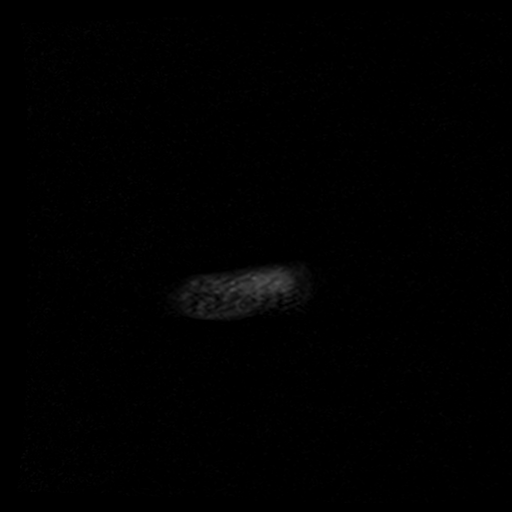
[im 6/28]
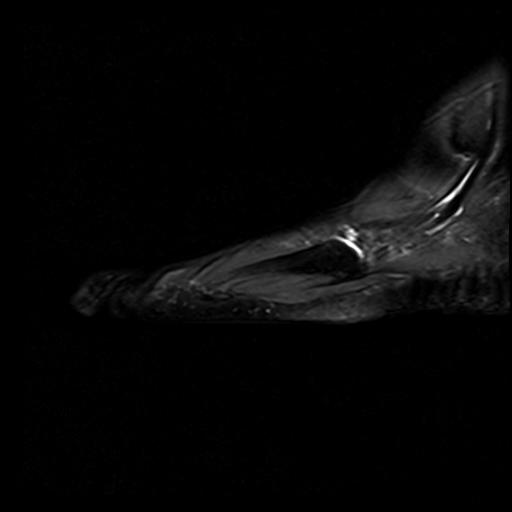
[im 11/28]
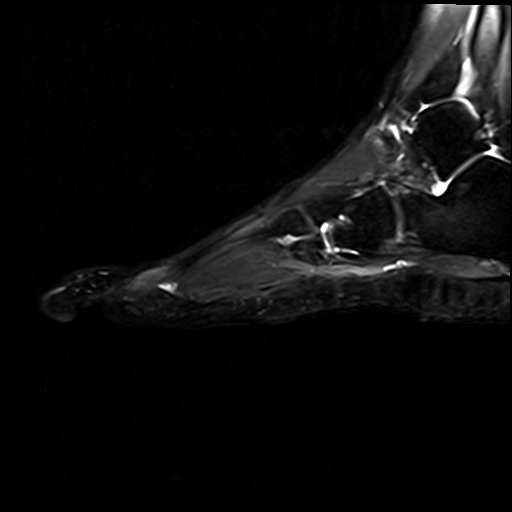
[im 17/28]
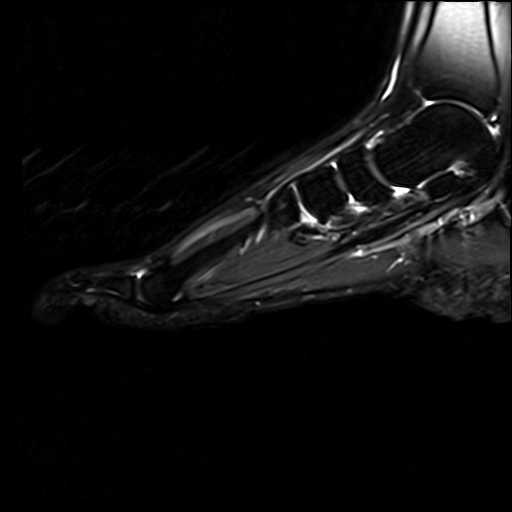
[im 22/28]
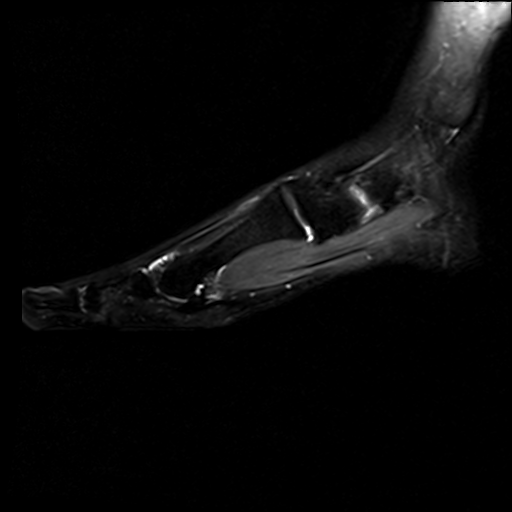
[im 28/28]
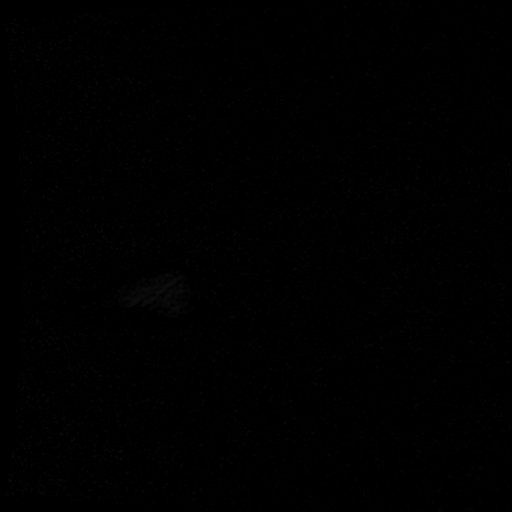

[40 of 40 positions shown; findings below may reference images not displayed]

FINDINGS: Bones/Joint/Cartilage

Marrow signal is normal throughout without fracture, stress change
or focal lesion.

Ligaments

The Lisfranc ligament is well seen and intact without intrasubstance
edema. No ligament tear or sprain is identified.

Muscles and Tendons

Intact and normal in appearance throughout.

Soft tissues

Negative.
IMPRESSION: Negative examination. Specifically, the Lisfranc ligament and all
imaged ligaments are intact and normal appearance.

## 2022-05-12 ENCOUNTER — Encounter: Payer: Self-pay | Admitting: *Deleted

## 2022-05-12 ENCOUNTER — Other Ambulatory Visit: Payer: Self-pay

## 2022-05-12 ENCOUNTER — Encounter
Admission: RE | Disposition: A | Discharge status: Home / Self Care | Payer: Self-pay | Source: Home / Self Care | Attending: Gastroenterology

## 2022-05-12 ENCOUNTER — Ambulatory Visit
Admission: RE | Admit: 2022-05-12 | Discharge: 2022-05-12 | Disposition: A | Discharge status: Home / Self Care | Payer: Medicaid Other | Attending: Gastroenterology | Admitting: Gastroenterology

## 2022-05-12 ENCOUNTER — Ambulatory Visit: Payer: Medicaid Other | Admitting: Anesthesiology

## 2022-05-12 DIAGNOSIS — K529 Noninfective gastroenteritis and colitis, unspecified: Secondary | ICD-10-CM | POA: Diagnosis not present

## 2022-05-12 DIAGNOSIS — Z6839 Body mass index (BMI) 39.0-39.9, adult: Secondary | ICD-10-CM | POA: Insufficient documentation

## 2022-05-12 DIAGNOSIS — Z9049 Acquired absence of other specified parts of digestive tract: Secondary | ICD-10-CM | POA: Insufficient documentation

## 2022-05-12 DIAGNOSIS — R1084 Generalized abdominal pain: Secondary | ICD-10-CM | POA: Diagnosis present

## 2022-05-12 DIAGNOSIS — K219 Gastro-esophageal reflux disease without esophagitis: Secondary | ICD-10-CM | POA: Insufficient documentation

## 2022-05-12 DIAGNOSIS — K56699 Other intestinal obstruction unspecified as to partial versus complete obstruction: Secondary | ICD-10-CM | POA: Insufficient documentation

## 2022-05-12 DIAGNOSIS — K589 Irritable bowel syndrome without diarrhea: Secondary | ICD-10-CM | POA: Insufficient documentation

## 2022-05-12 HISTORY — DX: Unspecified asthma, uncomplicated: J45.909

## 2022-05-12 HISTORY — PX: ESOPHAGOGASTRODUODENOSCOPY (EGD) WITH PROPOFOL: SHX5813

## 2022-05-12 HISTORY — DX: Restless legs syndrome: G25.81

## 2022-05-12 HISTORY — DX: Hypothyroidism, unspecified: E03.9

## 2022-05-12 HISTORY — DX: Calculus of kidney: N20.0

## 2022-05-12 HISTORY — DX: Tachycardia, unspecified: R00.0

## 2022-05-12 HISTORY — PX: COLONOSCOPY WITH PROPOFOL: SHX5780

## 2022-05-12 HISTORY — DX: Gastro-esophageal reflux disease without esophagitis: K21.9

## 2022-05-12 LAB — POCT PREGNANCY, URINE: Preg Test, Ur: NEGATIVE

## 2022-05-12 SURGERY — COLONOSCOPY WITH PROPOFOL
Anesthesia: General

## 2022-05-12 MED ORDER — PROPOFOL 10 MG/ML IV BOLUS
INTRAVENOUS | Status: DC | PRN
  Administered 2022-05-12: 50 mg via INTRAVENOUS
  Administered 2022-05-12: 100 mg via INTRAVENOUS

## 2022-05-12 MED ORDER — MIDAZOLAM HCL 2 MG/2ML IJ SOLN
INTRAMUSCULAR | Status: DC | PRN
  Administered 2022-05-12: 2 mg via INTRAVENOUS

## 2022-05-12 MED ORDER — ONDANSETRON HCL 4 MG/2ML IJ SOLN
INTRAMUSCULAR | Status: DC | PRN
  Administered 2022-05-12: 4 mg via INTRAVENOUS

## 2022-05-12 MED ORDER — MIDAZOLAM HCL 2 MG/2ML IJ SOLN
INTRAMUSCULAR | Status: AC
  Filled 2022-05-12: qty 2

## 2022-05-12 MED ORDER — FENTANYL CITRATE (PF) 100 MCG/2ML IJ SOLN
INTRAMUSCULAR | Status: DC | PRN
  Administered 2022-05-12 (×2): 50 ug via INTRAVENOUS

## 2022-05-12 MED ORDER — FENTANYL CITRATE (PF) 100 MCG/2ML IJ SOLN
INTRAMUSCULAR | Status: AC
  Filled 2022-05-12: qty 2

## 2022-05-12 MED ORDER — SODIUM CHLORIDE 0.9 % IV SOLN: INTRAVENOUS | Status: DC

## 2022-05-12 MED ORDER — PROPOFOL 500 MG/50ML IV EMUL
INTRAVENOUS | Status: DC | PRN
  Administered 2022-05-12: 150 ug/kg/min via INTRAVENOUS

## 2022-05-12 NOTE — Op Note (Signed)
[facility] Gastroenterology Patient Name: Brittany Stevens Procedure Date: 05/12/2022 8:42 AM MRN: [id number] Account #: [id number] Date of Birth: 10/24/1990 Admit Type: Outpatient Age: 31 Room: [facility] ENDO ROOM 1 Gender: Female Note Status: Finalized Instrument Name: Jasper Riling [id number] Procedure:             Colonoscopy Indications:           Generalized abdominal pain, Chronic diarrhea Providers:             Andrey Farmer MD, MD Referring MD:          No Local Md, MD (Referring MD) Medicines:             Monitored Anesthesia Care Complications:         No immediate complications. Estimated blood loss:                         Minimal. Procedure:             Pre-Anesthesia Assessment:                        - Prior to the procedure, a History and Physical was                         performed, and patient medications and allergies were                         reviewed. The patient is competent. The risks and                         benefits of the procedure and the sedation options and                         risks were discussed with the patient. All questions                         were answered and informed consent was obtained.                         Patient identification and proposed procedure were                         verified by the physician, the nurse, the                         anesthesiologist, the anesthetist and the technician                         in the endoscopy suite. Mental Status Examination:                         alert and oriented. Airway Examination: normal                         oropharyngeal airway and neck mobility. Respiratory                         Examination: clear to auscultation. CV Examination:  normal. Prophylactic Antibiotics: The patient does not                         require prophylactic antibiotics. Prior                         Anticoagulants: The patient has taken no anticoagulant                          or antiplatelet agents. ASA Grade Assessment: II - A                         patient with mild systemic disease. After reviewing                         the risks and benefits, the patient was deemed in                         satisfactory condition to undergo the procedure. The                         anesthesia plan was to use monitored anesthesia care                         (MAC). Immediately prior to administration of                         medications, the patient was re-assessed for adequacy                         to receive sedatives. The heart rate, respiratory                         rate, oxygen saturations, blood pressure, adequacy of                         pulmonary ventilation, and response to care were                         monitored throughout the procedure. The physical                         status of the patient was re-assessed after the                         procedure.                        After obtaining informed consent, the colonoscope was                         passed under direct vision. Throughout the procedure,                         the patient's blood pressure, pulse, and oxygen                         saturations were monitored continuously. The  Colonoscope was introduced through the anus and                         advanced to the the terminal ileum. The colonoscopy                         was somewhat difficult due to bowel stenosis. The                         patient tolerated the procedure well. The quality of                         the bowel preparation was good. The terminal ileum,                         ileocecal valve, appendiceal orifice, and rectum were                         photographed. Findings:      The perianal and digital rectal examinations were normal.      The terminal ileum contained a benign-appearing, intrinsic moderate       stenosis that was non-traversed. Biopsies were taken  with a cold forceps       for histology. Estimated blood loss was minimal.      The exam was otherwise without abnormality on direct and retroflexion       views. 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 Biopsied.                        - The examination was otherwise normal on direct and                         retroflexion views. Recommendation:        - Discharge patient to home.                        - Resume previous diet.                        - Perform a computed tomographic (CT scan)                         enterography vs. MRE at appointment to be scheduled. Procedure Code(s):     --- Professional ---                        [phone], Colonoscopy, flexible; with biopsy, single or                         multiple Diagnosis Code(s):     --- Professional ---                        P80.998, Other intestinal obstruction unspecified as                         to partial versus complete obstruction  R10.84, Generalized abdominal pain                        K52.9, Noninfective gastroenteritis and colitis,                         unspecified CPT copyright 2022 American Medical Association. All rights reserved. The codes documented in this report are preliminary and upon coder review may  be revised to meet current compliance requirements. Andrey Farmer MD, MD 05/12/2022 9:44:04 AM Number of Addenda: 0 Note Initiated On: 05/12/2022 8:42 AM Scope Withdrawal Time: 0 hours 16 minutes 27 seconds  Total Procedure Duration: 0 hours 20 minutes 35 seconds  Estimated Blood Loss:  Estimated blood loss was minimal.      [facility]

## 2022-05-12 NOTE — H&P (Signed)
Outpatient short stay form Pre-procedure 05/12/2022  Lesly Rubenstein, MD  Primary Physician: [facility]  Reason for visit:  Abdominal pain/Elevated fecal calprotectin  History of present illness:    31 y/o lady with history of obesity here for EGD/Colonoscopy for abdominal pain and diarrhea. No blood thinners. No family history of GI malignancies or IBD. History of cholecystectomy.    Current Facility-Administered Medications:    0.9 %  sodium chloride infusion, , Intravenous, Continuous, Sallee Hogrefe, Hilton Cork, MD, Last Rate: 20 mL/hr at 05/12/22 0846, New Bag at 05/12/22 0846  Medications Prior to Admission  Medication Sig Dispense Refill Last Dose   cephALEXin (KEFLEX) 500 MG capsule Take 1 capsule (500 mg total) by mouth 3 (three) times daily. 21 capsule 0    cetirizine (ZYRTEC) 10 MG tablet Take 10 mg by mouth daily.      dicyclomine (BENTYL) 20 MG tablet Take 1 tablet (20 mg total) by mouth every 6 (six) hours as needed. 20 tablet 0    famotidine (PEPCID) 40 MG tablet Take 40 mg by mouth daily.      naproxen (NAPROSYN) 500 MG tablet Take 1 tablet (500 mg total) by mouth 2 (two) times daily with a meal. 60 tablet 0    ondansetron (ZOFRAN ODT) 4 MG disintegrating tablet Take 1 tablet (4 mg total) by mouth every 8 (eight) hours as needed for nausea or vomiting. 20 tablet 0      No Known Allergies   Past Medical History:  Diagnosis Date   Acute sinusitis    Asthma    GERD (gastroesophageal reflux disease)    Hives    Hypothyroidism    IBS (irritable bowel syndrome)    Kidney stones    RLS (restless legs syndrome)    Tachycardia     Review of systems:  Otherwise negative.    Physical Exam  Gen: Alert, oriented. Appears stated age.  HEENT: PERRLA. Lungs: No respiratory distress CV: RRR Abd: soft, benign, no masses Ext: No edema    Planned procedures: Proceed with EGD/colonoscopy. The patient understands the nature of the planned procedure, indications,  risks, alternatives and potential complications including but not limited to bleeding, infection, perforation, damage to internal organs and possible oversedation/side effects from anesthesia. The patient agrees and gives consent to proceed.  Please refer to procedure notes for findings, recommendations and patient disposition/instructions.     Lesly Rubenstein, MD [facility] Gastroenterology

## 2022-05-12 NOTE — Anesthesia Postprocedure Evaluation (Signed)
Anesthesia Post Note  Patient: Brittany Stevens  Procedure(s) Performed: COLONOSCOPY WITH PROPOFOL ESOPHAGOGASTRODUODENOSCOPY (EGD) WITH PROPOFOL  Patient location during evaluation: Endoscopy Anesthesia Type: General Level of consciousness: awake and alert Pain management: pain level controlled Vital Signs Assessment: post-procedure vital signs reviewed and stable Respiratory status: spontaneous breathing, nonlabored ventilation and respiratory function stable (Pt w wheezing - stable "normal for me") Cardiovascular status: blood pressure returned to baseline and stable Postop Assessment: no apparent nausea or vomiting Anesthetic complications: no   No notable events documented.   Last Vitals:  Vitals:   05/12/22 1002 05/12/22 1008  BP: 130/89 133/84  Pulse: 84 91  Resp: 17 18  Temp:    SpO2: 100% 96%    Last Pain:  Vitals:   05/12/22 0952  TempSrc:   PainSc: 0-No pain                 Alphonsus Sias

## 2022-05-12 NOTE — Op Note (Signed)
[facility] Gastroenterology Patient Name: Brittany Stevens Procedure Date: 05/12/2022 8:44 AM MRN: [id number] Account #: [id number] Date of Birth: [birth year] Admit Type: Outpatient Age: 31 Room: [facility] ENDO ROOM 1 Gender: Female Note Status: Finalized Instrument Name: Upper Endoscope [id number] Procedure:             Upper GI endoscopy Indications:           Generalized abdominal pain Providers:             Andrey Farmer MD, MD Referring MD:          No Local Md, MD (Referring MD) Medicines:             Monitored Anesthesia Care Complications:         No immediate complications. Estimated blood loss:                         Minimal. Procedure:             Pre-Anesthesia Assessment:                        - Prior to the procedure, a History and Physical was                         performed, and patient medications and allergies were                         reviewed. The patient is competent. The risks and                         benefits of the procedure and the sedation options and                         risks were discussed with the patient. All questions                         were answered and informed consent was obtained.                         Patient identification and proposed procedure were                         verified by the physician, the nurse, the                         anesthesiologist, the anesthetist and the technician                         in the endoscopy suite. Mental Status Examination:                         alert and oriented. Airway Examination: normal                         oropharyngeal airway and neck mobility. Respiratory                         Examination: clear to auscultation. CV Examination:  normal. Prophylactic Antibiotics: The patient does not                         require prophylactic antibiotics. Prior                         Anticoagulants: The patient has taken no anticoagulant                          or antiplatelet agents. ASA Grade Assessment: II - A                         patient with mild systemic disease. After reviewing                         the risks and benefits, the patient was deemed in                         satisfactory condition to undergo the procedure. The                         anesthesia plan was to use monitored anesthesia care                         (MAC). Immediately prior to administration of                         medications, the patient was re-assessed for adequacy                         to receive sedatives. The heart rate, respiratory                         rate, oxygen saturations, blood pressure, adequacy of                         pulmonary ventilation, and response to care were                         monitored throughout the procedure. The physical                         status of the patient was re-assessed after the                         procedure.                        After obtaining informed consent, the endoscope was                         passed under direct vision. Throughout the procedure,                         the patient's blood pressure, pulse, and oxygen                         saturations were monitored continuously. The Endoscope  was introduced through the mouth, and advanced to the                         second part of duodenum. The upper GI endoscopy was                         accomplished without difficulty. The patient tolerated                         the procedure well. Findings:      The examined esophagus was normal.      The entire examined stomach was normal.      The examined duodenum was normal. Biopsies for histology were taken with       a cold forceps for evaluation of celiac disease. Estimated blood loss       was minimal. Impression:            - Normal esophagus.                        - Normal stomach.                        - Normal examined duodenum.  Biopsied. Recommendation:        - Await pathology results.                        - Perform a colonoscopy today. Procedure Code(s):     --- Professional ---                        [phone], Esophagogastroduodenoscopy, flexible,                         transoral; with biopsy, single or multiple Diagnosis Code(s):     --- Professional ---                        R10.84, Generalized abdominal pain CPT copyright 2022 American Medical Association. All rights reserved. The codes documented in this report are preliminary and upon coder review may  be revised to meet current compliance requirements. Andrey Farmer MD, MD 05/12/2022 9:38:30 AM Number of Addenda: 0 Note Initiated On: 05/12/2022 8:44 AM Estimated Blood Loss:  Estimated blood loss was minimal.      [facility]

## 2022-05-12 NOTE — Interval H&P Note (Signed)
History and Physical Interval Note:  05/12/2022 8:51 AM  Brittany Stevens  has presented today for surgery, with the diagnosis of periumbilical abdominal pain,diarrhea, dyspepsia,rectal bleeding.  The various methods of treatment have been discussed with the patient and family. After consideration of risks, benefits and other options for treatment, the patient has consented to  Procedure(s): COLONOSCOPY WITH PROPOFOL (N/A) ESOPHAGOGASTRODUODENOSCOPY (EGD) WITH PROPOFOL (N/A) as a surgical intervention.  The patient's history has been reviewed, patient examined, no change in status, stable for surgery.  I have reviewed the patient's chart and labs.  Questions were answered to the patient's satisfaction.     Lesly Rubenstein  Ok to proceed with EGD/Colonoscopy

## 2022-05-12 NOTE — Transfer of Care (Signed)
Immediate Anesthesia Transfer of Care Note  Patient: Brittany Stevens  Procedure(s) Performed: COLONOSCOPY WITH PROPOFOL ESOPHAGOGASTRODUODENOSCOPY (EGD) WITH PROPOFOL  Patient Location: PACU  Anesthesia Type:General  Level of Consciousness: awake and drowsy  Airway & Oxygen Therapy: Patient Spontanous Breathing and Patient connected to nasal cannula oxygen  Post-op Assessment: Report given to RN, Post -op Vital signs reviewed and stable and Patient moving all extremities  Post vital signs: Reviewed and stable  Last Vitals:  Vitals Value Taken Time  BP 131/95 05/12/22 0943  Temp    Pulse 108 05/12/22 0944  Resp 28 05/12/22 0944  SpO2 99 % 05/12/22 0944  Vitals shown include unvalidated device data.  Last Pain:  Vitals:   05/12/22 0840  TempSrc: Temporal  PainSc: 0-No pain         Complications: No notable events documented.

## 2022-05-12 NOTE — Anesthesia Preprocedure Evaluation (Addendum)
Anesthesia Evaluation  Patient identified by MRN, date of birth, ID band Patient awake    Reviewed: Allergy & Precautions, NPO status , Patient's Chart, lab work & pertinent test results  Airway Mallampati: II  TM Distance: >3 FB Neck ROM: full    Dental  (+) Chipped   Pulmonary asthma ,  Feels well this AM  Very mild wheezing, baseline per pt, No sx's   + wheezing      Cardiovascular Normal cardiovascular exam+ dysrhythmias   Rx'd for tachycardia    Neuro/Psych negative neurological ROS  negative psych ROS   GI/Hepatic Neg liver ROS, GERD  Controlled,  Endo/Other  Hypothyroidism Morbid obesity  Renal/GU Renal disease  negative genitourinary   Musculoskeletal   Abdominal   Peds  Hematology negative hematology ROS (+)   Anesthesia Other Findings Past Medical History: No date: Acute sinusitis No date: Hives No date: IBS (irritable bowel syndrome)  Past Surgical History: No date: CHOLECYSTECTOMY No date: TONSILLECTOMY     Reproductive/Obstetrics negative OB ROS                           Anesthesia Physical Anesthesia Plan  ASA: 2  Anesthesia Plan: General   Post-op Pain Management:    Induction: Intravenous  PONV Risk Score and Plan: Propofol infusion and TIVA  Airway Management Planned: Natural Airway and Nasal Cannula  Additional Equipment:   Intra-op Plan:   Post-operative Plan:   Informed Consent: I have reviewed the patients History and Physical, chart, labs and discussed the procedure including the risks, benefits and alternatives for the proposed anesthesia with the patient or authorized representative who has indicated his/her understanding and acceptance.     Dental Advisory Given  Plan Discussed with: Anesthesiologist, CRNA and Surgeon  Anesthesia Plan Comments: (Patient consented for risks of anesthesia including but not limited to:  - adverse reactions  to medications - risk of airway placement if required - damage to eyes, teeth, lips or other oral mucosa - nerve damage due to positioning  - sore throat or hoarseness - Damage to heart, brain, nerves, lungs, other parts of body or loss of life  Patient voiced understanding.)       Anesthesia Quick Evaluation

## 2022-05-13 ENCOUNTER — Other Ambulatory Visit: Payer: Self-pay | Admitting: Gastroenterology

## 2022-05-13 ENCOUNTER — Encounter: Payer: Self-pay | Admitting: Gastroenterology

## 2022-05-13 DIAGNOSIS — K50012 Crohn's disease of small intestine with intestinal obstruction: Secondary | ICD-10-CM

## 2022-05-13 LAB — SURGICAL PATHOLOGY

## 2022-05-19 ENCOUNTER — Ambulatory Visit
Admission: RE | Admit: 2022-05-19 | Discharge: 2022-05-19 | Disposition: A | Discharge status: Home / Self Care | Payer: Medicaid Other | Source: Ambulatory Visit | Attending: Gastroenterology | Admitting: Gastroenterology

## 2022-05-19 DIAGNOSIS — K50012 Crohn's disease of small intestine with intestinal obstruction: Secondary | ICD-10-CM | POA: Diagnosis present

## 2022-05-19 MED ORDER — GADOBUTROL 1 MMOL/ML IV SOLN
10.0000 mL | Freq: Once | INTRAVENOUS | Status: AC | PRN
  Administered 2022-05-19: 10 mL via INTRAVENOUS

## 2023-05-31 ENCOUNTER — Encounter: Payer: Self-pay | Admitting: *Deleted

## 2023-06-01 ENCOUNTER — Encounter
Admission: RE | Disposition: A | Discharge status: Home / Self Care | Payer: Self-pay | Source: Home / Self Care | Attending: Gastroenterology

## 2023-06-01 ENCOUNTER — Encounter: Payer: Self-pay | Admitting: *Deleted

## 2023-06-01 ENCOUNTER — Ambulatory Visit: Payer: Medicaid Other | Admitting: Anesthesiology

## 2023-06-01 ENCOUNTER — Ambulatory Visit
Admission: RE | Admit: 2023-06-01 | Discharge: 2023-06-01 | Disposition: A | Discharge status: Home / Self Care | Payer: Medicaid Other | Attending: Gastroenterology | Admitting: Gastroenterology

## 2023-06-01 DIAGNOSIS — J45909 Unspecified asthma, uncomplicated: Secondary | ICD-10-CM | POA: Insufficient documentation

## 2023-06-01 DIAGNOSIS — K6289 Other specified diseases of anus and rectum: Secondary | ICD-10-CM | POA: Diagnosis not present

## 2023-06-01 DIAGNOSIS — Z6841 Body Mass Index (BMI) 40.0 and over, adult: Secondary | ICD-10-CM | POA: Insufficient documentation

## 2023-06-01 DIAGNOSIS — E039 Hypothyroidism, unspecified: Secondary | ICD-10-CM | POA: Insufficient documentation

## 2023-06-01 DIAGNOSIS — K50012 Crohn's disease of small intestine with intestinal obstruction: Secondary | ICD-10-CM | POA: Insufficient documentation

## 2023-06-01 DIAGNOSIS — K219 Gastro-esophageal reflux disease without esophagitis: Secondary | ICD-10-CM | POA: Diagnosis not present

## 2023-06-01 DIAGNOSIS — E669 Obesity, unspecified: Secondary | ICD-10-CM | POA: Diagnosis not present

## 2023-06-01 DIAGNOSIS — K640 First degree hemorrhoids: Secondary | ICD-10-CM | POA: Insufficient documentation

## 2023-06-01 HISTORY — PX: COLONOSCOPY WITH PROPOFOL: SHX5780

## 2023-06-01 HISTORY — PX: BIOPSY: SHX5522

## 2023-06-01 LAB — POCT PREGNANCY, URINE: Preg Test, Ur: NEGATIVE

## 2023-06-01 SURGERY — COLONOSCOPY WITH PROPOFOL
Anesthesia: General

## 2023-06-01 MED ORDER — LIDOCAINE HCL (CARDIAC) PF 100 MG/5ML IV SOSY
PREFILLED_SYRINGE | INTRAVENOUS | Status: DC | PRN
  Administered 2023-06-01: 100 mg via INTRAVENOUS

## 2023-06-01 MED ORDER — PROPOFOL 10 MG/ML IV BOLUS
INTRAVENOUS | Status: AC
  Filled 2023-06-01: qty 20

## 2023-06-01 MED ORDER — PROPOFOL 1000 MG/100ML IV EMUL
INTRAVENOUS | Status: AC
Start: 2023-06-01 — End: ?
  Filled 2023-06-01: qty 100

## 2023-06-01 MED ORDER — DEXMEDETOMIDINE HCL IN NACL 200 MCG/50ML IV SOLN
INTRAVENOUS | Status: DC | PRN
  Administered 2023-06-01 (×2): 20 ug via INTRAVENOUS

## 2023-06-01 MED ORDER — LIDOCAINE HCL (PF) 2 % IJ SOLN
INTRAMUSCULAR | Status: AC
Start: 2023-06-01 — End: ?
  Filled 2023-06-01: qty 5

## 2023-06-01 MED ORDER — PROPOFOL 500 MG/50ML IV EMUL
INTRAVENOUS | Status: DC | PRN
  Administered 2023-06-01: 75 ug/kg/min via INTRAVENOUS

## 2023-06-01 MED ORDER — STERILE WATER FOR IRRIGATION IR SOLN
Status: DC | PRN
  Administered 2023-06-01: 60 mL

## 2023-06-01 MED ORDER — SODIUM CHLORIDE 0.9 % IV SOLN: INTRAVENOUS | Status: DC

## 2023-06-01 MED ORDER — PROPOFOL 10 MG/ML IV BOLUS
INTRAVENOUS | Status: DC | PRN
  Administered 2023-06-01 (×2): 50 mg via INTRAVENOUS

## 2023-06-01 NOTE — Op Note (Signed)
[facility] Gastroenterology Patient Name: Brittany Stevens Procedure Date: 06/01/2023 12:47 PM MRN: [id number] Account #: [id number] Date of Birth: 11-13-1990 Admit Type: Outpatient Age: 32 Room: [facility] ENDO ROOM 1 Gender: Female Note Status: Finalized Instrument Name: Prentice Docker [id number] Procedure:             Colonoscopy Indications:           Crohn's disease of the small bowel Providers:             Eather Colas MD, MD Referring MD:          No Local Md, MD (Referring MD) Medicines:             Monitored Anesthesia Care Complications:         No immediate complications. Procedure:             Pre-Anesthesia Assessment:                        - Prior to the procedure, a History and Physical was                         performed, and patient medications and allergies were                         reviewed. The patient is competent. The risks and                         benefits of the procedure and the sedation options and                         risks were discussed with the patient. All questions                         were answered and informed consent was obtained.                         Patient identification and proposed procedure were                         verified by the physician, the nurse, the                         anesthesiologist, the anesthetist and the technician                         in the endoscopy suite. Mental Status Examination:                         alert and oriented. Airway Examination: normal                         oropharyngeal airway and neck mobility. Respiratory                         Examination: clear to auscultation. CV Examination:                         normal. Prophylactic Antibiotics: The patient does not  require prophylactic antibiotics. Prior                         Anticoagulants: The patient has taken no anticoagulant                         or antiplatelet agents. ASA Grade Assessment:  II - A                         patient with mild systemic disease. After reviewing                         the risks and benefits, the patient was deemed in                         satisfactory condition to undergo the procedure. The                         anesthesia plan was to use monitored anesthesia care                         (MAC). Immediately prior to administration of                         medications, the patient was re-assessed for adequacy                         to receive sedatives. The heart rate, respiratory                         rate, oxygen saturations, blood pressure, adequacy of                         pulmonary ventilation, and response to care were                         monitored throughout the procedure. The physical                         status of the patient was re-assessed after the                         procedure.                        After obtaining informed consent, the colonoscope was                         passed under direct vision. Throughout the procedure,                         the patient's blood pressure, pulse, and oxygen                         saturations were monitored continuously. The                         Colonoscope was introduced through the anus and  advanced to the the terminal ileum. The colonoscopy                         was performed without difficulty. The patient                         tolerated the procedure well. The quality of the bowel                         preparation was fair. The terminal ileum, ileocecal                         valve, appendiceal orifice, and rectum were                         photographed. Findings:      The perianal and digital rectal examinations were normal.      The terminal ileum contained a benign-appearing, intrinsic moderate       stenosis that was non-traversed. Appeared improved from previous       colonoscopy in terms of inflammation. Biopsies were taken  with a cold       forceps for histology. Estimated blood loss was minimal.      Anal papilla(e) were hypertrophied.      Internal hemorrhoids were found during retroflexion. The hemorrhoids       were Grade I (internal hemorrhoids that do not prolapse).      The exam was otherwise without abnormality on direct and retroflexion       views. Impression:            - Preparation of the colon was fair.                        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 Biopsied.                        - Anal papilla(e) were hypertrophied.                        - Internal hemorrhoids.                        - The examination was otherwise normal on direct and                         retroflexion views. Recommendation:        - Discharge patient to home.                        - Resume previous diet.                        - Continue present medications.                        - Repeat colonoscopy PRN to assess disease activity.                        - Can consider MRE for evaluation of severity of  stricture. Asymptomatic currently so no need for                         surgical referral at this time. Procedure Code(s):     --- Professional ---                        [phone], Colonoscopy, flexible; with biopsy, single or                         multiple Diagnosis Code(s):     --- Professional ---                        K64.0, First degree hemorrhoids                        K50.012, Crohn's disease of small intestine with                         intestinal obstruction                        K62.89, Other specified diseases of anus and rectum CPT copyright 2022 American Medical Association. All rights reserved. The codes documented in this report are preliminary and upon coder review may  be revised to meet current compliance requirements. Eather Colas MD, MD 06/01/2023 1:16:51 PM Number of Addenda: 0 Note Initiated On: 06/01/2023 12:47 PM Scope Withdrawal Time: 0 hours 7  minutes 3 seconds  Total Procedure Duration: 0 hours 14 minutes 16 seconds  Estimated Blood Loss:  Estimated blood loss: none.      [facility]

## 2023-06-01 NOTE — Transfer of Care (Signed)
Immediate Anesthesia Transfer of Care Note  Patient: Brittany Stevens  Procedure(s) Performed: COLONOSCOPY WITH PROPOFOL BIOPSY  Patient Location: PACU  Anesthesia Type:General  Level of Consciousness: sedated  Airway & Oxygen Therapy: Patient Spontanous Breathing  Post-op Assessment: Report given to RN and Post -op Vital signs reviewed and stable  Post vital signs: Reviewed and stable  Last Vitals:  Vitals Value Taken Time  BP 112/73 06/01/23 1315  Temp 36.2 C 06/01/23 1312  Pulse 102 06/01/23 1315  Resp 22 06/01/23 1315  SpO2 98 % 06/01/23 1315  Vitals shown include unfiled device data.  Last Pain:  Vitals:   06/01/23 1312  TempSrc: Temporal  PainSc: Asleep         Complications: No notable events documented.

## 2023-06-01 NOTE — H&P (Signed)
Outpatient short stay form Pre-procedure 06/01/2023  Regis Bill, MD  Primary Physician: [facility]  Reason for visit:  Crohn's disease  History of present illness:    32 y/o lady with history of hypothyroidism, obesity, and stricturing crohns here for colonoscopy to assess disease activity. No blood thinners. No family history of IBD. No significant abdominal surgeries.    Current Facility-Administered Medications:    0.9 %  sodium chloride infusion, , Intravenous, Continuous, Kathyrn Warmuth, Rossie Muskrat, MD, Last Rate: 20 mL/hr at 06/01/23 1246, New Bag at 06/01/23 1246  Medications Prior to Admission  Medication Sig Dispense Refill Last Dose   diltiazem (CARDIZEM CD) 120 MG 24 hr capsule Take 120 mg by mouth daily.   05/31/2023   metoprolol succinate (TOPROL-XL) 25 MG 24 hr tablet Take 12.5 mg by mouth daily.   05/31/2023   albuterol (VENTOLIN HFA) 108 (90 Base) MCG/ACT inhaler Inhale 1-2 puffs into the lungs every 6 (six) hours as needed for wheezing or shortness of breath.      cephALEXin (KEFLEX) 500 MG capsule Take 1 capsule (500 mg total) by mouth 3 (three) times daily. (Patient not taking: Reported on 06/01/2023) 21 capsule 0 Completed Course   cetirizine (ZYRTEC) 10 MG tablet Take 10 mg by mouth daily.      dicyclomine (BENTYL) 20 MG tablet Take 1 tablet (20 mg total) by mouth every 6 (six) hours as needed. 20 tablet 0    famotidine (PEPCID) 40 MG tablet Take 40 mg by mouth daily.      fluticasone (FLONASE) 50 MCG/ACT nasal spray Place 1-2 sprays into both nostrils daily as needed for allergies or rhinitis.      fluticasone-salmeterol (ADVAIR) 250-50 MCG/ACT AEPB Inhale 1 puff into the lungs in the morning and at bedtime.      levothyroxine (SYNTHROID) 88 MCG tablet Take 88 mcg by mouth daily before breakfast.      naproxen (NAPROSYN) 500 MG tablet Take 1 tablet (500 mg total) by mouth 2 (two) times daily with a meal. 60 tablet 0    ondansetron (ZOFRAN ODT) 4 MG  disintegrating tablet Take 1 tablet (4 mg total) by mouth every 8 (eight) hours as needed for nausea or vomiting. 20 tablet 0    rOPINIRole (REQUIP) 0.25 MG tablet Take 0.25-0.5 mg by mouth at bedtime.        No Known Allergies   Past Medical History:  Diagnosis Date   Acute sinusitis    Asthma    GERD (gastroesophageal reflux disease)    Hives    Hypothyroidism    IBS (irritable bowel syndrome)    Kidney stones    RLS (restless legs syndrome)    Tachycardia     Review of systems:  Otherwise negative.    Physical Exam  Gen: Alert, oriented. Appears stated age.  HEENT: PERRLA. Lungs: No respiratory distress CV: RRR Abd: soft, benign, no masses Ext: No edema    Planned procedures: Proceed with colonoscopy. The patient understands the nature of the planned procedure, indications, risks, alternatives and potential complications including but not limited to bleeding, infection, perforation, damage to internal organs and possible oversedation/side effects from anesthesia. The patient agrees and gives consent to proceed.  Please refer to procedure notes for findings, recommendations and patient disposition/instructions.     Regis Bill, MD [facility]

## 2023-06-01 NOTE — Interval H&P Note (Signed)
History and Physical Interval Note:  06/01/2023 12:50 PM  Brittany Stevens  has presented today for surgery, with the diagnosis of Crohn's Disease.  The various methods of treatment have been discussed with the patient and family. After consideration of risks, benefits and other options for treatment, the patient has consented to  Procedure(s): COLONOSCOPY WITH PROPOFOL (N/A) as a surgical intervention.  The patient's history has been reviewed, patient examined, no change in status, stable for surgery.  I have reviewed the patient's chart and labs.  Questions were answered to the patient's satisfaction.     Regis Bill  Ok to proceed with colonoscopy

## 2023-06-01 NOTE — Anesthesia Preprocedure Evaluation (Signed)
Anesthesia Evaluation  Patient identified by MRN, date of birth, ID band Patient awake    Reviewed: Allergy & Precautions, H&P , NPO status , Patient's Chart, lab work & pertinent test results, reviewed documented beta blocker date and time   Airway Mallampati: II   Neck ROM: full    Dental  (+) Poor Dentition   Pulmonary asthma    Pulmonary exam normal        Cardiovascular Exercise Tolerance: Good negative cardio ROS Normal cardiovascular exam Rhythm:regular Rate:Normal     Neuro/Psych negative neurological ROS  negative psych ROS   GI/Hepatic Neg liver ROS,GERD  Medicated,,  Endo/Other  Hypothyroidism    Renal/GU Renal disease  negative genitourinary   Musculoskeletal   Abdominal   Peds  Hematology negative hematology ROS (+)   Anesthesia Other Findings Past Medical History: No date: Acute sinusitis No date: Asthma No date: GERD (gastroesophageal reflux disease) No date: Hives No date: Hypothyroidism No date: IBS (irritable bowel syndrome) No date: Kidney stones No date: RLS (restless legs syndrome) No date: Tachycardia Past Surgical History: No date: CHOLECYSTECTOMY 05/12/2022: COLONOSCOPY WITH PROPOFOL; N/A     Comment:  Procedure: COLONOSCOPY WITH PROPOFOL;  Surgeon:               Regis Bill, MD;  Location: [facility];                Service: Endoscopy;  Laterality: N/A; 05/12/2022: ESOPHAGOGASTRODUODENOSCOPY (EGD) WITH PROPOFOL; N/A     Comment:  Procedure: ESOPHAGOGASTRODUODENOSCOPY (EGD) WITH               PROPOFOL;  Surgeon: Regis Bill, MD;  Location:               [facility];  Service: Endoscopy;  Laterality: N/A; No date: TONSILLECTOMY   Reproductive/Obstetrics negative OB ROS                             Anesthesia Physical Anesthesia Plan  ASA: 2  Anesthesia Plan: General   Post-op Pain Management:    Induction:   PONV Risk  Score and Plan:   Airway Management Planned:   Additional Equipment:   Intra-op Plan:   Post-operative Plan:   Informed Consent: I have reviewed the patients History and Physical, chart, labs and discussed the procedure including the risks, benefits and alternatives for the proposed anesthesia with the patient or authorized representative who has indicated his/her understanding and acceptance.     Dental Advisory Given  Plan Discussed with: CRNA  Anesthesia Plan Comments:        Anesthesia Quick Evaluation

## 2023-06-02 ENCOUNTER — Encounter: Payer: Self-pay | Admitting: Gastroenterology

## 2023-06-02 LAB — SURGICAL PATHOLOGY

## 2023-06-02 NOTE — Anesthesia Postprocedure Evaluation (Signed)
Anesthesia Post Note  Patient: Brittany Stevens  Procedure(s) Performed: COLONOSCOPY WITH PROPOFOL BIOPSY  Patient location during evaluation: PACU Anesthesia Type: General Level of consciousness: awake and alert Pain management: pain level controlled Vital Signs Assessment: post-procedure vital signs reviewed and stable Respiratory status: spontaneous breathing, nonlabored ventilation, respiratory function stable and patient connected to nasal cannula oxygen Cardiovascular status: blood pressure returned to baseline and stable Postop Assessment: no apparent nausea or vomiting Anesthetic complications: no   No notable events documented.   Last Vitals:  Vitals:   06/01/23 1322 06/01/23 1332  BP: 103/72 123/86  Pulse: (!) 101 80  Resp: (!) 21 20  Temp:  (!) 36.2 C  SpO2: 100% 100%    Last Pain:  Vitals:   06/01/23 1332  TempSrc: Temporal  PainSc: 0-No pain                 Stephanie Coup

## 2023-06-07 ENCOUNTER — Ambulatory Visit: Payer: Medicaid Other

## 2023-06-07 ENCOUNTER — Ambulatory Visit
Admission: EM | Admit: 2023-06-07 | Discharge: 2023-06-07 | Disposition: A | Discharge status: Home / Self Care | Payer: Medicaid Other

## 2023-06-07 DIAGNOSIS — S92325A Nondisplaced fracture of second metatarsal bone, left foot, initial encounter for closed fracture: Secondary | ICD-10-CM | POA: Diagnosis not present

## 2023-06-07 MED ORDER — OXYCODONE-ACETAMINOPHEN 5-325 MG PO TABS: 1.0000 | ORAL_TABLET | Freq: Four times a day (QID) | ORAL | 0 refills | Status: DC | PRN

## 2023-06-07 NOTE — Discharge Instructions (Addendum)
Your x-rays have the appearance of a possible fracture to your second metatarsal.  Wear the cam boot to immobilize your foot and prevent further injury.  Do not bear weight on your foot, but rather use the crutches to assist you with ambulation.  Keep your left foot elevated is much as possible.  You may take the boot off when you are sitting at home or in bed but otherwise I want you to wear it.  When your boot is off you may apply ice to your foot for 20 minutes at a time, 2-3 times a day.  This up with swelling and pain.  Take over-the-counter Tylenol and/or ibuprofen according the package instructions as needed for mild to moderate pain.  Take the Percocet I prescribed you as needed for severe pain.  Be mindful this medication will sedate you see do not drink alcohol or drive if you take it.  Also be mindful that it contains Tylenol and do not take more than 4000 mg of Tylenol in total during the day.  If you develop any increased pain, swelling, or numbness in your toes I recommend that you seek care in the ER.

## 2023-06-07 NOTE — ED Provider Notes (Signed)
[facility]    CSN: [id number] Arrival date & time: 06/07/23  1730      History   Chief Complaint Chief Complaint  Patient presents with   Foot Pain   Fall    HPI Brittany Stevens is a 32 y.o. female.   HPI  32 year old female with a past medical history significant for hypothyroidism, GERD, IBS, RLS, and asthma presents for evaluation of pain on the top of her left foot.  She reports that she was walking into her house and tripped over the ledge causing herself to stumble which also resulted in her hyper dorsiflexing her left foot.  She got significant swelling to the foot and is unable to bear weight as a result of the pain.  She also endorses some numbness in her toes.  Past Medical History:  Diagnosis Date   Acute sinusitis    Asthma    GERD (gastroesophageal reflux disease)    Hives    Hypothyroidism    IBS (irritable bowel syndrome)    Kidney stones    RLS (restless legs syndrome)    Tachycardia     There are no problems to display for this patient.   Past Surgical History:  Procedure Laterality Date   BIOPSY  06/01/2023   Procedure: BIOPSY;  Surgeon: Regis Bill, MD;  Location: [facility];  Service: Endoscopy;;   CHOLECYSTECTOMY     COLONOSCOPY WITH PROPOFOL N/A 05/12/2022   Procedure: COLONOSCOPY WITH PROPOFOL;  Surgeon: Regis Bill, MD;  Location: [facility];  Service: Endoscopy;  Laterality: N/A;   COLONOSCOPY WITH PROPOFOL N/A 06/01/2023   Procedure: COLONOSCOPY WITH PROPOFOL;  Surgeon: Regis Bill, MD;  Location: [facility];  Service: Endoscopy;  Laterality: N/A;   ESOPHAGOGASTRODUODENOSCOPY (EGD) WITH PROPOFOL N/A 05/12/2022   Procedure: ESOPHAGOGASTRODUODENOSCOPY (EGD) WITH PROPOFOL;  Surgeon: Regis Bill, MD;  Location: [facility];  Service: Endoscopy;  Laterality: N/A;   TONSILLECTOMY      OB History     Gravida  1   Para      Term      Preterm      AB      Living          SAB      IAB      Ectopic      Multiple      Live Births               Home Medications    Prior to Admission medications   Medication Sig Start Date End Date Taking? Authorizing Provider  albuterol (VENTOLIN HFA) 108 (90 Base) MCG/ACT inhaler Inhale 1-2 puffs into the lungs every 6 (six) hours as needed for wheezing or shortness of breath.   Yes [provider]  cetirizine (ZYRTEC) 10 MG tablet Take 10 mg by mouth daily.   Yes [provider]  dicyclomine (BENTYL) 20 MG tablet Take 1 tablet (20 mg total) by mouth every 6 (six) hours as needed. 01/07/17  Yes Irean Hong, MD  diltiazem (CARDIZEM CD) 120 MG 24 hr capsule Take 120 mg by mouth daily.   Yes [provider]  famotidine (PEPCID) 40 MG tablet Take 40 mg by mouth daily.   Yes [provider]  fluticasone (FLONASE) 50 MCG/ACT nasal spray Place 1-2 sprays into both nostrils daily as needed for allergies or rhinitis.   Yes [provider]  fluticasone-salmeterol (ADVAIR) 250-50 MCG/ACT AEPB Inhale 1 puff into the lungs in the  morning and at bedtime.   Yes [provider]  JUNEL FE 1/20 1-20 MG-MCG tablet Take 1 tablet by mouth daily.   Yes [provider]  levothyroxine (SYNTHROID) 88 MCG tablet Take 88 mcg by mouth daily before breakfast.   Yes [provider]  metoprolol succinate (TOPROL-XL) 25 MG 24 hr tablet Take 12.5 mg by mouth daily.   Yes [provider]  mirtazapine (REMERON) 30 MG tablet Take 30 mg by mouth at bedtime.   Yes [provider]  naproxen (NAPROSYN) 500 MG tablet Take 1 tablet (500 mg total) by mouth 2 (two) times daily with a meal. 06/07/16  Yes Ovid Curd P, PA-C  ondansetron (ZOFRAN ODT) 4 MG disintegrating tablet Take 1 tablet (4 mg total) by mouth every 8 (eight) hours as needed for nausea or vomiting. 01/07/17  Yes Irean Hong, MD  ondansetron (ZOFRAN) 4 MG tablet Take 4 mg by mouth every 8 (eight) hours as  needed. 03/12/23  Yes [provider]  oxyCODONE-acetaminophen (PERCOCET/ROXICET) 5-325 MG tablet Take 1 tablet by mouth every 6 (six) hours as needed for severe pain (pain score 7-10). 06/07/23  Yes Becky Augusta, NP  rOPINIRole (REQUIP) 0.25 MG tablet Take 0.25-0.5 mg by mouth at bedtime.   Yes [provider]    Family History Family History  Problem Relation Age of Onset   Healthy Mother    Healthy Father     Social History Social History   Tobacco Use   Smoking status: Never   Smokeless tobacco: Never  Vaping Use   Vaping status: Never Used  Substance Use Topics   Alcohol use: Not Currently   Drug use: Never     Allergies   Patient has no known allergies.   Review of Systems Review of Systems  Musculoskeletal:  Positive for arthralgias and joint swelling.  Skin:  Positive for color change.  Neurological:  Positive for numbness.     Physical Exam Triage Vital Signs ED Triage Vitals  Encounter Vitals Group     BP 06/07/23 1806 (!) 130/106     Systolic BP Percentile --      Diastolic BP Percentile --      Pulse Rate 06/07/23 1806 (!) 115     Resp 06/07/23 1806 16     Temp 06/07/23 1806 98.6 F (37 C)     Temp Source 06/07/23 1806 Oral     SpO2 06/07/23 1806 97 %     Weight 06/07/23 1805 260 lb (117.9 kg)     Height 06/07/23 1805 5\' 5"  (1.651 m)     Head Circumference --      Peak Flow --      Pain Score 06/07/23 1808 10     Pain Loc --      Pain Education --      Exclude from Growth Chart --    No data found.  Updated Vital Signs BP (!) 130/106 (BP Location: Left Arm)   Pulse (!) 115   Temp 98.6 F (37 C) (Oral)   Resp 16   Ht 5\' 5"  (1.651 m)   Wt 260 lb (117.9 kg)   LMP 05/25/2023 (Approximate)   SpO2 97%   BMI 43.27 kg/m   Visual Acuity Right Eye Distance:   Left Eye Distance:   Bilateral Distance:    Right Eye Near:   Left Eye Near:    Bilateral Near:     Physical Exam Vitals and nursing note reviewed.  Constitutional:      Appearance: Normal appearance. She is not ill-appearing.  Musculoskeletal:        General: Swelling, tenderness and signs of injury present.  Skin:    General: Skin is warm and dry.     Capillary Refill: Capillary refill takes less than 2 seconds.     Findings: Bruising present.  Neurological:     General: No focal deficit present.     Mental Status: She is alert and oriented to person, place, and time.      UC Treatments / Results  Labs (all labs ordered are listed, but only abnormal results are displayed) Labs Reviewed - No data to display  EKG   Radiology No results found.  Procedures Procedures (including critical care time)  Medications Ordered in UC Medications - No data to display  Initial Impression / Assessment and Plan / UC Course  I have reviewed the triage vital signs and the nursing notes.  Pertinent labs & imaging results that were available during my care of the patient were reviewed by me and considered in my medical decision making (see chart for details).   Patient is a nontoxic-appearing 32 year old female presenting for evaluation of pain and swelling to the dorsum of her left foot after suffering a hyperdorsiflexion injury this afternoon.  As you can see in image above, there is marked swelling throughout the midfoot starting at the MTP joints of her toes and going proximally.  This is markedly tender to palpation.  There is also some mild ecchymosis forming at the MTP joints of her second and third toe.  DP and PT pulses are 2+.  She has full sensation in her toes and range of motion of her toes.  Cap refills less than 3 seconds.  I will obtain radiograph of the left foot to rule out any bony abnormality.  Left foot x-rays independently reviewed and evaluated by me.  Impression: In the AP view only there appears to be a fracture of the proximal aspect of the second metatarsal.  This is not apparent in any of the other views.  There  is marked soft tissue swelling present on exam.  Radiology overread is pending.  I will discharge patient home with a diagnosis of presumptive sector metatarsal fracture and a cam boot with crutches and have her follow-up with podiatry.  Ice, elevation, and no weightbearing until after she is seen by podiatry.  She can use over-the-counter Tylenol and/or ibuprofen as needed for mild to moderate pain.  I will send her home with a prescription for Percocet that she can use as needed for more severe pain.  Final Clinical Impressions(s) / UC Diagnoses   Final diagnoses:  Nondisplaced fracture of second metatarsal bone, left foot, initial encounter for closed fracture     Discharge Instructions      Your x-rays have the appearance of a possible fracture to your second metatarsal.  Wear the cam boot to immobilize your foot and prevent further injury.  Do not bear weight on your foot, but rather use the crutches to assist you with ambulation.  Keep your left foot elevated is much as possible.  You may take the boot off when you are sitting at home or in bed but otherwise I want you to wear it.  When your boot is off you may apply ice to your foot for 20 minutes at a time, 2-3 times a day.  This up with swelling and pain.  Take over-the-counter Tylenol  and/or ibuprofen according the package instructions as needed for mild to moderate pain.  Take the Percocet I prescribed you as needed for severe pain.  Be mindful this medication will sedate you see do not drink alcohol or drive if you take it.  Also be mindful that it contains Tylenol and do not take more than 4000 mg of Tylenol in total during the day.  If you develop any increased pain, swelling, or numbness in your toes I recommend that you seek care in the ER.     ED Prescriptions     Medication Sig Dispense Auth. Provider   oxyCODONE-acetaminophen (PERCOCET/ROXICET) 5-325 MG tablet Take 1 tablet by mouth every 6 (six) hours as  needed for severe pain (pain score 7-10). 15 tablet Becky Augusta, NP      I have reviewed the PDMP during this encounter.   Becky Augusta, NP 06/07/23 [phone]

## 2023-06-07 NOTE — ED Triage Notes (Signed)
Pt c/o L foot pain due to fall today. States she was coming up some steps & missed one.

## 2023-06-22 ENCOUNTER — Ambulatory Visit (INDEPENDENT_AMBULATORY_CARE_PROVIDER_SITE_OTHER): Payer: Medicaid Other | Admitting: Podiatry

## 2023-06-22 ENCOUNTER — Encounter: Payer: Self-pay | Admitting: Podiatry

## 2023-06-22 DIAGNOSIS — S93325A Dislocation of tarsometatarsal joint of left foot, initial encounter: Secondary | ICD-10-CM | POA: Diagnosis not present

## 2023-06-22 NOTE — Progress Notes (Signed)
Chief Complaint  Patient presents with   Fracture    "They said I broke my 2nd metatarsal." N -  broke 2nd met L - 2nd met left D - 06/07/2023 O - suddenly C - sharp pain, swelling, bruised A - walking, standing, boot is heavy - too big T - [facility], Xrays, boot, scooter    HPI: 32 y.o. female presenting today as a new patient for evaluation of an injury sustained on 06/07/2023 when she tripped and fell on her stairs.  She went to the urgent care and diagnosed with fracture at the base of the second metatarsal.  Currently NWB in a cam boot.  Past Medical History:  Diagnosis Date   Acute sinusitis    Asthma    GERD (gastroesophageal reflux disease)    Hives    Hypothyroidism    IBS (irritable bowel syndrome)    Kidney stones    RLS (restless legs syndrome)    Tachycardia     Past Surgical History:  Procedure Laterality Date   BIOPSY  06/01/2023   Procedure: BIOPSY;  Surgeon: Regis Bill, MD;  Location: [facility];  Service: Endoscopy;;   CHOLECYSTECTOMY     COLONOSCOPY WITH PROPOFOL N/A 05/12/2022   Procedure: COLONOSCOPY WITH PROPOFOL;  Surgeon: Regis Bill, MD;  Location: [facility];  Service: Endoscopy;  Laterality: N/A;   COLONOSCOPY WITH PROPOFOL N/A 06/01/2023   Procedure: COLONOSCOPY WITH PROPOFOL;  Surgeon: Regis Bill, MD;  Location: [facility];  Service: Endoscopy;  Laterality: N/A;   ESOPHAGOGASTRODUODENOSCOPY (EGD) WITH PROPOFOL N/A 05/12/2022   Procedure: ESOPHAGOGASTRODUODENOSCOPY (EGD) WITH PROPOFOL;  Surgeon: Regis Bill, MD;  Location: [facility];  Service: Endoscopy;  Laterality: N/A;   TONSILLECTOMY      No Known Allergies   Physical Exam: General: The patient is alert and oriented x3 in no acute distress.  Dermatology: Skin is warm, dry and supple bilateral lower extremities.   Vascular: Palpable pedal pulses bilaterally. Capillary refill within normal limits.  No appreciable edema.  No  erythema.  Neurological: Grossly intact via light touch  Musculoskeletal Exam: No pedal deformities noted  Radiographic Exam LT foot 06/07/2023:  Normal osseous mineralization.  Diastases of the Lisfranc joint between the base of the second metatarsal and medial cuneiform.  Positive fleck sign also noted within the Lisfranc joint.  Findings concerning for Lisfranc fracture/dislocation  Assessment/Plan of Care: 1.  Lisfranc fracture/dislocation LT foot.  DOI: 06/07/2023  -Patient evaluated.  X-rays reviewed -Stat MRI wo contrast LT foot ordered to visualize the integrity of the Lisfranc joint. -Due to the diastases of the Lisfranc joint I do believe that she will need surgical ORIF to stabilize and reduce the diastases and create stability to the foot.  This was discussed in detail with the patient.  Prior to surgery she will need MRI but we will go ahead and initiate surgical consent today.  Risk benefits advantages and disadvantages of the procedure were explained in detail to the patient including the postoperative recovery course.  No guarantees were expressed or implied.  All patient questions were answered.  Patient consents for surgery -Authorization for surgery was initiated today.  Surgery will consist of open reduction internal fixation Lisfranc fracture/dislocation LT foot -In the meantime continue NWB CAM boot with the knee scooter -Return to clinic 1 week postop       Felecia Shelling, DPM [facility]  Dr. Felecia Shelling, DPM    [address] [place], [place] [address]                Office [phone]  Fax [phone]

## 2023-06-22 NOTE — H&P (View-Only) (Signed)
 Chief Complaint  Patient presents with   Fracture    "They said I broke my 2nd metatarsal." N -  broke 2nd met L - 2nd met left D - 06/07/2023 O - suddenly C - sharp pain, swelling, bruised A - walking, standing, boot is heavy - too big T - [facility] [place], Xrays, boot, scooter    HPI: 32 y.o. female presenting today as a new patient for evaluation of an injury sustained on 06/07/2023 when she tripped and fell on her stairs.  She went to the urgent care and diagnosed with fracture at the base of the second metatarsal.  Currently NWB in a cam boot.  Past Medical History:  Diagnosis Date   Acute sinusitis    Asthma    GERD (gastroesophageal reflux disease)    Hives    Hypothyroidism    IBS (irritable bowel syndrome)    Kidney stones    RLS (restless legs syndrome)    Tachycardia     Past Surgical History:  Procedure Laterality Date   BIOPSY  06/01/2023   Procedure: BIOPSY;  Surgeon: Regis Bill, MD;  Location: [facility];  Service: Endoscopy;;   CHOLECYSTECTOMY     COLONOSCOPY WITH PROPOFOL N/A 05/12/2022   Procedure: COLONOSCOPY WITH PROPOFOL;  Surgeon: Regis Bill, MD;  Location: [facility];  Service: Endoscopy;  Laterality: N/A;   COLONOSCOPY WITH PROPOFOL N/A 06/01/2023   Procedure: COLONOSCOPY WITH PROPOFOL;  Surgeon: Regis Bill, MD;  Location: [facility];  Service: Endoscopy;  Laterality: N/A;   ESOPHAGOGASTRODUODENOSCOPY (EGD) WITH PROPOFOL N/A 05/12/2022   Procedure: ESOPHAGOGASTRODUODENOSCOPY (EGD) WITH PROPOFOL;  Surgeon: Regis Bill, MD;  Location: [facility];  Service: Endoscopy;  Laterality: N/A;   TONSILLECTOMY      No Known Allergies   Physical Exam: General: The patient is alert and oriented x3 in no acute distress.  Dermatology: Skin is warm, dry and supple bilateral lower extremities.   Vascular: Palpable pedal pulses bilaterally. Capillary refill within normal limits.  No appreciable edema.  No  erythema.  Neurological: Grossly intact via light touch  Musculoskeletal Exam: No pedal deformities noted  Radiographic Exam LT foot 06/07/2023:  Normal osseous mineralization.  Diastases of the Lisfranc joint between the base of the second metatarsal and medial cuneiform.  Positive fleck sign also noted within the Lisfranc joint.  Findings concerning for Lisfranc fracture/dislocation  Assessment/Plan of Care: 1.  Lisfranc fracture/dislocation LT foot.  DOI: 06/07/2023  -Patient evaluated.  X-rays reviewed -Stat MRI wo contrast LT foot ordered to visualize the integrity of the Lisfranc joint. -Due to the diastases of the Lisfranc joint I do believe that she will need surgical ORIF to stabilize and reduce the diastases and create stability to the foot.  This was discussed in detail with the patient.  Prior to surgery she will need MRI but we will go ahead and initiate surgical consent today.  Risk benefits advantages and disadvantages of the procedure were explained in detail to the patient including the postoperative recovery course.  No guarantees were expressed or implied.  All patient questions were answered.  Patient consents for surgery -Authorization for surgery was initiated today.  Surgery will consist of open reduction internal fixation Lisfranc fracture/dislocation LT foot -In the meantime continue NWB CAM boot with the knee scooter -Return to clinic 1 week postop       Felecia Shelling, DPM [facility]  Dr. Felecia Shelling, DPM    [address] [place], [place] [address]                Office [phone]  Fax [phone]

## 2023-06-25 ENCOUNTER — Ambulatory Visit
Admission: RE | Admit: 2023-06-25 | Discharge: 2023-06-25 | Disposition: A | Discharge status: Home / Self Care | Payer: Medicaid Other | Source: Ambulatory Visit | Attending: Podiatry

## 2023-06-25 DIAGNOSIS — S93325A Dislocation of tarsometatarsal joint of left foot, initial encounter: Secondary | ICD-10-CM

## 2023-07-01 ENCOUNTER — Telehealth: Payer: Self-pay | Admitting: Podiatry

## 2023-07-01 NOTE — Telephone Encounter (Signed)
DOS- 07/08/23  OPEN REDUCTION INTERNAL FIXATION LEFT- 98119  [place] COMPLETE EFFECTIVE DATE- 07/14/19  COINSURANCE- 0%  SPOKE WITH NATASHA P FROM [facility] AND SHE STATED THAT PRIOR AUTH IS NOT REQUIRED FOR CPT CODE 14782.   CALL REF #Reva Bores 07/01/23 @ 9:59AM EST

## 2023-07-02 ENCOUNTER — Other Ambulatory Visit: Payer: Self-pay

## 2023-07-02 ENCOUNTER — Encounter
Admission: RE | Admit: 2023-07-02 | Discharge: 2023-07-02 | Disposition: A | Discharge status: Home / Self Care | Payer: Medicaid Other | Source: Ambulatory Visit | Attending: Podiatry | Admitting: Podiatry

## 2023-07-02 VITALS — Ht 65.0 in | Wt 260.0 lb

## 2023-07-02 DIAGNOSIS — Z01818 Encounter for other preprocedural examination: Secondary | ICD-10-CM

## 2023-07-02 DIAGNOSIS — R000 Tachycardia, unspecified: Secondary | ICD-10-CM

## 2023-07-02 HISTORY — DX: Personal history of urinary calculi: Z87.442

## 2023-07-02 NOTE — Patient Instructions (Addendum)
Your procedure is scheduled on: Thursday 07/08/23 To find out your arrival time, please call [phone] between 1PM - 3PM on:  Tuesday 07/06/23 Report to the Registration Desk on the 1st floor of the [facility]. Valet parking is available.  If your arrival time is 6:00 am, do not arrive before that time as the [facility] entrance doors do not open until 6:00 am.  REMEMBER: Instructions that are not followed completely may result in serious medical risk, up to and including death; or upon the discretion of your surgeon and anesthesiologist your surgery may need to be rescheduled.  Do not eat food after midnight the night before surgery.  No gum chewing or hard candies.  You may however, drink CLEAR liquids up to 2 hours before you are scheduled to arrive for your surgery. Do not drink anything within 2 hours of your scheduled arrival time.  Clear liquids include: - water  - apple juice without pulp - gatorade (not RED colors) - black coffee or tea (Do NOT add milk or creamers to the coffee or tea) Do NOT drink anything that is not on this list.  Type 1 and Type 2 diabetics should only drink water.  One week prior to surgery: Stop Anti-inflammatories (NSAIDS) such as Advil, Aleve, Ibuprofen, Motrin, Naproxen, Naprosyn and Aspirin based products such as Excedrin, Goody's Powder, BC Powder. You may however, continue to take Tylenol if needed for pain up until the day of surgery.  Stop ANY OVER THE COUNTER supplements until after surgery.  Continue taking all prescribed medications.   TAKE ONLY THESE MEDICATIONS THE MORNING OF SURGERY WITH A SIP OF WATER:  diltiazem (CARDIZEM CD) 120 MG 24 hr capsule  levothyroxine (SYNTHROID) 88 MCG tablet   No Alcohol for 24 hours before or after surgery.  No Smoking including e-cigarettes for 24 hours before surgery.  No chewable tobacco products for at least 6 hours before surgery.  No nicotine patches on the day of surgery.  Do not  use any "recreational" drugs for at least a week (preferably 2 weeks) before your surgery.  Please be advised that the combination of cocaine and anesthesia may have negative outcomes, up to and including death. If you test positive for cocaine, your surgery will be cancelled.  On the morning of surgery brush your teeth with toothpaste and water, you may rinse your mouth with mouthwash if you wish. Do not swallow any toothpaste or mouthwash.  Use CHG Soap or wipes as directed on instruction sheet. Use soap given to you at Dr Bernette Redbird office  Do not wear lotions, powders, or perfumes.   Do not shave body hair from the neck down 48 hours before surgery.  Wear comfortable clothing (specific to your surgery type) to the hospital.  Do not wear jewelry, make-up, hairpins, clips or nail polish.  For welded (permanent) jewelry: bracelets, anklets, waist bands, etc.  Please have this removed prior to surgery.  If it is not removed, there is a chance that hospital personnel will need to cut it off on the day of surgery. Contact lenses, hearing aids and dentures may not be worn into surgery.  Do not bring valuables to the hospital. [facility] is not responsible for any missing/lost belongings or valuables.   Notify your doctor if there is any change in your medical condition (cold, fever, infection).  If you are being discharged the day of surgery, you will not be allowed to drive home. You will need a responsible individual  to drive you home and stay with you for 24 hours after surgery.   If you are taking public transportation, you will need to have a responsible individual with you.  If you are being admitted to the hospital overnight, leave your suitcase in the car. After surgery it may be brought to your room.  In case of increased patient census, it may be necessary for you, the patient, to continue your postoperative care in the Same Day Surgery department.  After surgery, you can help  prevent lung complications by doing breathing exercises.  Take deep breaths and cough every 1-2 hours. Your doctor may order a device called an Incentive Spirometer to help you take deep breaths. When coughing or sneezing, hold a pillow firmly against your incision with both hands. This is called "splinting." Doing this helps protect your incision. It also decreases belly discomfort.  Surgery Visitation Policy:  Patients undergoing a surgery or procedure may have two family members or support persons with them as long as the person is not COVID-19 positive or experiencing its symptoms.   Inpatient Visitation:    Visiting hours are 7 a.m. to 8 p.m. Up to four visitors are allowed at one time in a patient room. The visitors may rotate out with other people during the day. One designated support person (adult) may remain overnight.  Please call the Pre-admissions Testing Dept. at [phone] if you have any questions about these instructions.

## 2023-07-08 ENCOUNTER — Ambulatory Visit: Payer: Medicaid Other

## 2023-07-08 ENCOUNTER — Encounter: Payer: Self-pay | Admitting: Podiatry

## 2023-07-08 ENCOUNTER — Other Ambulatory Visit: Payer: Self-pay

## 2023-07-08 ENCOUNTER — Encounter
Admission: RE | Disposition: A | Discharge status: Home / Self Care | Payer: Self-pay | Source: Home / Self Care | Attending: Podiatry

## 2023-07-08 ENCOUNTER — Ambulatory Visit: Payer: Medicaid Other | Admitting: General Practice

## 2023-07-08 ENCOUNTER — Ambulatory Visit
Admission: RE | Admit: 2023-07-08 | Discharge: 2023-07-08 | Disposition: A | Discharge status: Home / Self Care | Payer: Medicaid Other | Attending: Podiatry | Admitting: Podiatry

## 2023-07-08 DIAGNOSIS — J45909 Unspecified asthma, uncomplicated: Secondary | ICD-10-CM | POA: Diagnosis not present

## 2023-07-08 DIAGNOSIS — Z6841 Body Mass Index (BMI) 40.0 and over, adult: Secondary | ICD-10-CM | POA: Diagnosis not present

## 2023-07-08 DIAGNOSIS — Z79899 Other long term (current) drug therapy: Secondary | ICD-10-CM | POA: Insufficient documentation

## 2023-07-08 DIAGNOSIS — E66813 Obesity, class 3: Secondary | ICD-10-CM | POA: Diagnosis not present

## 2023-07-08 DIAGNOSIS — E039 Hypothyroidism, unspecified: Secondary | ICD-10-CM | POA: Insufficient documentation

## 2023-07-08 DIAGNOSIS — W109XXA Fall (on) (from) unspecified stairs and steps, initial encounter: Secondary | ICD-10-CM | POA: Diagnosis not present

## 2023-07-08 DIAGNOSIS — R000 Tachycardia, unspecified: Secondary | ICD-10-CM

## 2023-07-08 DIAGNOSIS — K219 Gastro-esophageal reflux disease without esophagitis: Secondary | ICD-10-CM | POA: Insufficient documentation

## 2023-07-08 DIAGNOSIS — S93325A Dislocation of tarsometatarsal joint of left foot, initial encounter: Secondary | ICD-10-CM | POA: Diagnosis present

## 2023-07-08 DIAGNOSIS — Z01818 Encounter for other preprocedural examination: Secondary | ICD-10-CM

## 2023-07-08 DIAGNOSIS — S93325D Dislocation of tarsometatarsal joint of left foot, subsequent encounter: Secondary | ICD-10-CM | POA: Diagnosis not present

## 2023-07-08 DIAGNOSIS — S93325S Dislocation of tarsometatarsal joint of left foot, sequela: Secondary | ICD-10-CM | POA: Diagnosis not present

## 2023-07-08 HISTORY — PX: OPEN REDUCTION INTERNAL FIXATION (ORIF) FOOT LISFRANC FRACTURE: SHX5990

## 2023-07-08 LAB — POCT PREGNANCY, URINE: Preg Test, Ur: NEGATIVE

## 2023-07-08 SURGERY — OPEN REDUCTION INTERNAL FIXATION (ORIF) FOOT LISFRANC FRACTURE
Anesthesia: General | Site: Foot | Laterality: Left

## 2023-07-08 MED ORDER — BUPIVACAINE HCL 0.5 % IJ SOLN
INTRAMUSCULAR | Status: DC | PRN
  Administered 2023-07-08: 10 mL

## 2023-07-08 MED ORDER — DEXAMETHASONE SODIUM PHOSPHATE 10 MG/ML IJ SOLN
INTRAMUSCULAR | Status: AC
  Filled 2023-07-08: qty 1

## 2023-07-08 MED ORDER — MIDAZOLAM HCL 2 MG/2ML IJ SOLN
INTRAMUSCULAR | Status: DC | PRN
  Administered 2023-07-08: 2 mg via INTRAVENOUS

## 2023-07-08 MED ORDER — DEXAMETHASONE SODIUM PHOSPHATE 10 MG/ML IJ SOLN
INTRAMUSCULAR | Status: DC | PRN
  Administered 2023-07-08: 10 mg via INTRAVENOUS

## 2023-07-08 MED ORDER — ONDANSETRON HCL 4 MG/2ML IJ SOLN
INTRAMUSCULAR | Status: AC
  Filled 2023-07-08: qty 2

## 2023-07-08 MED ORDER — OXYCODONE HCL 5 MG PO TABS
ORAL_TABLET | ORAL | Status: AC
  Filled 2023-07-08: qty 1

## 2023-07-08 MED ORDER — FENTANYL CITRATE (PF) 100 MCG/2ML IJ SOLN
25.0000 ug | INTRAMUSCULAR | Status: DC | PRN
  Administered 2023-07-08 (×2): 25 ug via INTRAVENOUS

## 2023-07-08 MED ORDER — OXYCODONE HCL 5 MG/5ML PO SOLN: 5.0000 mg | Freq: Once | ORAL | Status: AC | PRN

## 2023-07-08 MED ORDER — METOPROLOL TARTRATE 5 MG/5ML IV SOLN
INTRAVENOUS | Status: DC | PRN
  Administered 2023-07-08 (×2): 1 mg via INTRAVENOUS

## 2023-07-08 MED ORDER — LACTATED RINGERS IV SOLN: INTRAVENOUS | Status: DC

## 2023-07-08 MED ORDER — CEFAZOLIN SODIUM-DEXTROSE 2-4 GM/100ML-% IV SOLN
INTRAVENOUS | Status: AC
  Filled 2023-07-08: qty 100

## 2023-07-08 MED ORDER — ROCURONIUM BROMIDE 10 MG/ML (PF) SYRINGE
PREFILLED_SYRINGE | INTRAVENOUS | Status: AC
  Filled 2023-07-08: qty 10

## 2023-07-08 MED ORDER — ACETAMINOPHEN 10 MG/ML IV SOLN
INTRAVENOUS | Status: DC | PRN
  Administered 2023-07-08: 1000 mg via INTRAVENOUS

## 2023-07-08 MED ORDER — ONDANSETRON HCL 4 MG/2ML IJ SOLN
4.0000 mg | Freq: Once | INTRAMUSCULAR | Status: AC
  Administered 2023-07-08: 4 mg via INTRAVENOUS

## 2023-07-08 MED ORDER — OXYCODONE HCL 5 MG PO TABS
5.0000 mg | ORAL_TABLET | Freq: Once | ORAL | Status: AC | PRN
  Administered 2023-07-08: 5 mg via ORAL

## 2023-07-08 MED ORDER — SUGAMMADEX SODIUM 200 MG/2ML IV SOLN
INTRAVENOUS | Status: DC | PRN
  Administered 2023-07-08: 400 mg via INTRAVENOUS

## 2023-07-08 MED ORDER — LIDOCAINE HCL (PF) 2 % IJ SOLN
INTRAMUSCULAR | Status: AC
  Filled 2023-07-08: qty 5

## 2023-07-08 MED ORDER — OXYCODONE-ACETAMINOPHEN 5-325 MG PO TABS: 1.0000 | ORAL_TABLET | ORAL | 0 refills | Status: DC | PRN

## 2023-07-08 MED ORDER — FENTANYL CITRATE (PF) 100 MCG/2ML IJ SOLN
INTRAMUSCULAR | Status: AC
  Filled 2023-07-08: qty 2

## 2023-07-08 MED ORDER — CEFAZOLIN SODIUM-DEXTROSE 2-3 GM-%(50ML) IV SOLR
INTRAVENOUS | Status: DC | PRN
  Administered 2023-07-08: 2 g via INTRAVENOUS

## 2023-07-08 MED ORDER — PROPOFOL 10 MG/ML IV BOLUS
INTRAVENOUS | Status: AC
  Filled 2023-07-08: qty 20

## 2023-07-08 MED ORDER — ONDANSETRON HCL 4 MG/2ML IJ SOLN
INTRAMUSCULAR | Status: DC | PRN
  Administered 2023-07-08: 4 mg via INTRAVENOUS

## 2023-07-08 MED ORDER — CHLORHEXIDINE GLUCONATE 0.12 % MT SOLN: 15.0000 mL | Freq: Once | OROMUCOSAL | Status: DC

## 2023-07-08 MED ORDER — PROPOFOL 10 MG/ML IV BOLUS
INTRAVENOUS | Status: DC | PRN
  Administered 2023-07-08: 180 mg via INTRAVENOUS
  Administered 2023-07-08: 20 mg via INTRAVENOUS

## 2023-07-08 MED ORDER — ACETAMINOPHEN 10 MG/ML IV SOLN
INTRAVENOUS | Status: AC
  Filled 2023-07-08: qty 100

## 2023-07-08 MED ORDER — LIDOCAINE HCL 1 % IJ SOLN
INTRAMUSCULAR | Status: DC | PRN
  Administered 2023-07-08: 10 mL

## 2023-07-08 MED ORDER — LIDOCAINE HCL (PF) 1 % IJ SOLN
INTRAMUSCULAR | Status: AC
  Filled 2023-07-08: qty 30

## 2023-07-08 MED ORDER — MIDAZOLAM HCL 2 MG/2ML IJ SOLN
INTRAMUSCULAR | Status: AC
  Filled 2023-07-08: qty 2

## 2023-07-08 MED ORDER — LIDOCAINE-EPINEPHRINE 2 %-1:100000 IJ SOLN
INTRAMUSCULAR | Status: AC
  Filled 2023-07-08: qty 1

## 2023-07-08 MED ORDER — LIDOCAINE HCL (CARDIAC) PF 100 MG/5ML IV SOSY
PREFILLED_SYRINGE | INTRAVENOUS | Status: DC | PRN
  Administered 2023-07-08: 100 mg via INTRAVENOUS

## 2023-07-08 MED ORDER — ROCURONIUM BROMIDE 100 MG/10ML IV SOLN
INTRAVENOUS | Status: DC | PRN
  Administered 2023-07-08: 50 mg via INTRAVENOUS

## 2023-07-08 MED ORDER — BUPIVACAINE HCL (PF) 0.5 % IJ SOLN
INTRAMUSCULAR | Status: AC
  Filled 2023-07-08: qty 30

## 2023-07-08 MED ORDER — ORAL CARE MOUTH RINSE
15.0000 mL | Freq: Once | OROMUCOSAL | Status: DC
Start: 2023-07-08 — End: 2023-07-08

## 2023-07-08 MED ORDER — FENTANYL CITRATE (PF) 100 MCG/2ML IJ SOLN
INTRAMUSCULAR | Status: DC | PRN
  Administered 2023-07-08: 50 ug via INTRAVENOUS
  Administered 2023-07-08: 100 ug via INTRAVENOUS
  Administered 2023-07-08: 50 ug via INTRAVENOUS

## 2023-07-08 SURGICAL SUPPLY — 34 items
BENZOIN TINCTURE PRP APPL 2/3 (GAUZE/BANDAGES/DRESSINGS) IMPLANT
BIT DRILL CANNULTD 2.6 X 130MM (DRILL) IMPLANT
BLADE SURG 15 STRL LF DISP TIS (BLADE) IMPLANT
BNDG COHESIVE 6X5 TAN ST LF (GAUZE/BANDAGES/DRESSINGS) IMPLANT
BNDG ELASTIC 4INX 5YD STR LF (GAUZE/BANDAGES/DRESSINGS) IMPLANT
BNDG ELASTIC 6INX 5YD STR LF (GAUZE/BANDAGES/DRESSINGS) IMPLANT
BNDG ESMARCH 4X12 STRL LF (GAUZE/BANDAGES/DRESSINGS) ×1 IMPLANT
CHLORAPREP W/TINT 26 (MISCELLANEOUS) ×1 IMPLANT
COUNTERSICK 4.0 HEADED (MISCELLANEOUS) ×1
CUFF TRNQT CYL 30X4X21-28X (TOURNIQUET CUFF) IMPLANT
DRAPE C-ARM 42X72 X-RAY (DRAPES) IMPLANT
DRAPE IMP U-DRAPE 54X76 (DRAPES) ×1 IMPLANT
DRILL CANNULATED 2.6 X 130MM (DRILL) ×1
ELECT REM PT RETURN 9FT ADLT (ELECTROSURGICAL) ×1
ELECTRODE REM PT RTRN 9FT ADLT (ELECTROSURGICAL) ×1 IMPLANT
GAUZE SPONGE 4X4 12PLY STRL (GAUZE/BANDAGES/DRESSINGS) ×1 IMPLANT
GAUZE XEROFORM 1X8 LF (GAUZE/BANDAGES/DRESSINGS) ×1 IMPLANT
GLOVE BIO SURGEON STRL SZ8 (GLOVE) ×1 IMPLANT
GLOVE BIOGEL PI IND STRL 8 (GLOVE) ×1 IMPLANT
GOWN STRL REUS W/ TWL LRG LVL3 (GOWN DISPOSABLE) ×1 IMPLANT
GOWN STRL REUS W/ TWL XL LVL3 (GOWN DISPOSABLE) ×1 IMPLANT
K-WIRE SNGL END 1.2X150 (MISCELLANEOUS) ×1
KWIRE SNGL END 1.2X150 (MISCELLANEOUS) IMPLANT
NDL SAFETY ECLIPSE 18X1.5 (NEEDLE) ×1 IMPLANT
NS IRRIG 500ML POUR BTL (IV SOLUTION) ×1 IMPLANT
PACK EXTREMITY ARMC (MISCELLANEOUS) ×1 IMPLANT
PAD CAST 4YDX4 CTTN HI CHSV (CAST SUPPLIES) ×1 IMPLANT
PADDING CAST BLEND 4X4 STRL (MISCELLANEOUS) ×1 IMPLANT
SCREW CANN SHT THR 4X34 (Screw) IMPLANT
SCREW COUNTERSINK 4.0 HEADED (MISCELLANEOUS) IMPLANT
SPONGE T-LAP 18X18 ~~LOC~~+RFID (SPONGE) IMPLANT
STOCKINETTE STRL 6IN 960660 (GAUZE/BANDAGES/DRESSINGS) ×1 IMPLANT
STRIP CLOSURE SKIN 1/2X4 (GAUZE/BANDAGES/DRESSINGS) IMPLANT
SYR 10ML LL (SYRINGE) ×1 IMPLANT

## 2023-07-08 NOTE — Interval H&P Note (Signed)
History and Physical Interval Note:  07/08/2023 8:08 AM  Brittany Stevens  has presented today for surgery, with the diagnosis of Lisfranc dislocation.  The various methods of treatment have been discussed with the patient and family. After consideration of risks, benefits and other options for treatment, the patient has consented to  Procedure(s): OPEN REDUCTION INTERNAL FIXATION (ORIF) FOOT LISFRANC FRACTURE (Left) as a surgical intervention.  The patient's history has been reviewed, patient examined, no change in status, stable for surgery.  I have reviewed the patient's chart and labs.  Questions were answered to the patient's satisfaction.     Felecia Shelling

## 2023-07-08 NOTE — Discharge Instructions (Addendum)
Leave dressings clean dry and intact until follow-up visit in the office. Ice and elevation over the next 72 hours. Strict nonweightbearing in the cam boot using the knee scooter x 4-6 weeks

## 2023-07-08 NOTE — Anesthesia Preprocedure Evaluation (Addendum)
Anesthesia Evaluation  Patient identified by MRN, date of birth, ID band Patient awake    Reviewed: Allergy & Precautions, H&P , NPO status , Patient's Chart, lab work & pertinent test results, reviewed documented beta blocker date and time   Airway Mallampati: IV   Neck ROM: full    Dental  (+) Poor Dentition, Dental Advidsory Given   Pulmonary asthma    Pulmonary exam normal        Cardiovascular Exercise Tolerance: Good Normal cardiovascular exam+ dysrhythmias + Valvular Problems/Murmurs MR  Rhythm:regular Rate:Normal     Neuro/Psych negative neurological ROS  negative psych ROS   GI/Hepatic Neg liver ROS,GERD  Medicated,,  Endo/Other  Hypothyroidism  Class 3 obesity  Renal/GU      Musculoskeletal   Abdominal   Peds  Hematology negative hematology ROS (+)   Anesthesia Other Findings Patient with a PMH of tachycardia. This morning HR is 120. Patient states its due to her anxiety. EKG ordered. No hx of heart attacks, stents, surgeries on her heart. Patient with TTE shows mild MR. Patient states she feels her normal self this morning. Denies chest pain, shortness of breath. METS > 4. Patient aware of risk under anesthesia and willing to proceed.   Past Medical History: No date: Acute sinusitis No date: Asthma No date: GERD (gastroesophageal reflux disease) No date: Hives No date: Hypothyroidism No date: IBS (irritable bowel syndrome) No date: Kidney stones No date: RLS (restless legs syndrome) No date: Tachycardia Past Surgical History: No date: CHOLECYSTECTOMY 05/12/2022: COLONOSCOPY WITH PROPOFOL; N/A     Comment:  Procedure: COLONOSCOPY WITH PROPOFOL;  Surgeon:               Regis Bill, MD;  Location: [facility];                Service: Endoscopy;  Laterality: N/A; 05/12/2022: ESOPHAGOGASTRODUODENOSCOPY (EGD) WITH PROPOFOL; N/A     Comment:  Procedure: ESOPHAGOGASTRODUODENOSCOPY (EGD) WITH                PROPOFOL;  Surgeon: Regis Bill, MD;  Location:               [facility];  Service: Endoscopy;  Laterality: N/A; No date: TONSILLECTOMY   Reproductive/Obstetrics negative OB ROS                             Anesthesia Physical Anesthesia Plan  ASA: 3  Anesthesia Plan: General ETT and General   Post-op Pain Management:    Induction: Intravenous  PONV Risk Score and Plan: 3 and Ondansetron, Dexamethasone and Midazolam  Airway Management Planned: Oral ETT  Additional Equipment:   Intra-op Plan:   Post-operative Plan: Extubation in OR  Informed Consent: I have reviewed the patients History and Physical, chart, labs and discussed the procedure including the risks, benefits and alternatives for the proposed anesthesia with the patient or authorized representative who has indicated his/her understanding and acceptance.     Dental Advisory Given  Plan Discussed with: Anesthesiologist, CRNA and Surgeon  Anesthesia Plan Comments: (Patient consented for risks of anesthesia including but not limited to:  - adverse reactions to medications - damage to eyes, teeth, lips or other oral mucosa - nerve damage due to positioning  - sore throat or hoarseness - Damage to heart, brain, nerves, lungs, other parts of body or loss of life  Patient voiced understanding and assent.)  Anesthesia Quick Evaluation

## 2023-07-08 NOTE — Op Note (Signed)
   OPERATIVE REPORT Patient name: Brittany Stevens MRN: [id number] DOB: Nov 26, 1990  DOS: 07/08/23  Preop Dx: Lisfranc fracture dislocation left Postop Dx: same  Procedure:  1.  Open reduction internal fixation Lisfranc fracture dislocation left  Surgeon: Felecia Shelling DPM  Anesthesia: General anesthesia  Hemostasis: Calf tourniquet inflated to a pressure of after esmarch exsanguination   EBL: Minimal mL Materials: Paragon 28 4.67mm cannulated screw x 34mm Injectables: 50-50 mixture of 1% lidocaine plain with 0.5% Marcaine plain totaling 20 mL around the left foot Pathology: None  Condition: The patient tolerated the procedure and anesthesia well. No complications noted or reported   Justification for procedure: The patient is a 32 y.o. female who presents today for surgical correction of Lisfranc fracture dislocation to the left foot. The patient was told benefits as well as possible side effects of the surgery. The patient consented for surgical correction. The patient consent form was reviewed. All patient questions were answered. No guarantees were expressed or implied. The patient and the surgeon both signed the patient consent form with the witness present and placed in the patient's chart.   Procedure in Detail: The patient was brought to the operating room, placed in the operating table in the supine position at which time an aseptic scrub and drape were performed about the patient's respective lower extremity after anesthesia was induced as described above. Attention was then directed to the surgical area where procedure number one commenced.  Procedure #1: Open reduction internal fixation Lisfranc fracture dislocation left Attention was directed to the left foot and guidewire for the Lisfranc screw was inserted and a medial proximal to distal lateral orientation crossing the Lisfranc joint.  Guided by intraoperative x-ray fluoroscopy.  After appropriate guidewire  placement which nicely traversed the Lisfranc joint a small incision was planned and made around the insertion of the guidewire approximately 0.5 cm long in a linear longitudinal fashion.  Blunt dissection down to the level of bone.  A 4.0 mm cannulated threaded screw was inserted in the standard AO fashion for permanent internal fixation.  Verified by intraoperative x-ray fluoroscopy which demonstrated good reduction of the Lisfranc fracture dislocation as well as apposition of the second TMT.  Temporary guidewire was removed and primary closure was achieved using 4-0 Prolene suture.  Dry sterile compressive dressings were then applied to all previously mentioned incision sites about the patient's lower extremity. The tourniquet which was used for hemostasis was deflated. All normal neurovascular responses including pink color and warmth returned all the digits of patient's lower extremity.  The patient was then transferred from the operating room to the recovery room having tolerated the procedure and anesthesia well. All vital signs are stable. After a brief stay in the recovery room the patient was discharged with postoperative orders placed.   Felecia Shelling, DPM [facility]  Dr. Felecia Shelling, DPM    [address] [place], [place] [address]                Office [phone]  Fax [phone]

## 2023-07-08 NOTE — Anesthesia Procedure Notes (Signed)
Procedure Name: Intubation Date/Time: 07/08/2023 8:29 AM  Performed by: Ginger Carne, CRNAPre-anesthesia Checklist: Patient identified, Emergency Drugs available, Suction available, Patient being monitored and Timeout performed Patient Re-evaluated:Patient Re-evaluated prior to induction Oxygen Delivery Method: Circle system utilized Preoxygenation: Pre-oxygenation with 100% oxygen Induction Type: IV induction Ventilation: Mask ventilation without difficulty Laryngoscope Size: McGrath and 3 Grade View: Grade II Tube type: Oral Tube size: 7.0 mm Number of attempts: 1 Airway Equipment and Method: Stylet and Video-laryngoscopy Placement Confirmation: ETT inserted through vocal cords under direct vision, positive ETCO2 and breath sounds checked- equal and bilateral Secured at: 21 cm Tube secured with: Tape Dental Injury: Teeth and Oropharynx as per pre-operative assessment

## 2023-07-08 NOTE — Brief Op Note (Signed)
07/08/2023  9:17 AM  PATIENT:  Brittany Stevens  32 y.o. female  PRE-OPERATIVE DIAGNOSIS:  Lisfranc dislocation  POST-OPERATIVE DIAGNOSIS:  Lisfranc dislocation  PROCEDURE:  Procedure(s): OPEN REDUCTION INTERNAL FIXATION (ORIF) FOOT LISFRANC FRACTURE (Left)  SURGEON:  Surgeons and Role:    Felecia Shelling, DPM - Primary  PHYSICIAN ASSISTANT:   ASSISTANTS: none   ANESTHESIA:   general  EBL:  2 mL   BLOOD ADMINISTERED:none  DRAINS: none   LOCAL MEDICATIONS USED:  MARCAINE   , LIDOCAINE , and Amount: 20 ml  SPECIMEN:  No Specimen  DISPOSITION OF SPECIMEN:  N/A  COUNTS:  YES  TOURNIQUET: Calf tourniquet inflated to pressure 250 mmHg  DICTATION: .Dragon Dictation  PLAN OF CARE: Discharge to home after PACU  PATIENT DISPOSITION:  PACU - hemodynamically stable.   Delay start of Pharmacological VTE agent (>24hrs) due to surgical blood loss or risk of bleeding: not applicable

## 2023-07-08 NOTE — Anesthesia Postprocedure Evaluation (Signed)
Anesthesia Post Note  Patient: Brittany Stevens  Procedure(s) Performed: OPEN REDUCTION INTERNAL FIXATION (ORIF) FOOT LISFRANC FRACTURE (Left: Foot)  Patient location during evaluation: PACU Anesthesia Type: General Level of consciousness: awake and alert Pain management: pain level controlled Vital Signs Assessment: post-procedure vital signs reviewed and stable Respiratory status: spontaneous breathing, nonlabored ventilation, respiratory function stable and patient connected to nasal cannula oxygen Cardiovascular status: blood pressure returned to baseline and stable Postop Assessment: no apparent nausea or vomiting Anesthetic complications: no   No notable events documented.   Last Vitals:  Vitals:   07/08/23 0945 07/08/23 1032  BP: 116/76 124/80  Pulse: (!) 107 (!) 113  Resp: 14 18  Temp:  36.9 C  SpO2: 95% 95%    Last Pain:  Vitals:   07/08/23 1032  TempSrc: Temporal  PainSc: 2                  Cleda Mccreedy Larrell Rapozo

## 2023-07-08 NOTE — Transfer of Care (Signed)
Immediate Anesthesia Transfer of Care Note  Patient: Brittany Stevens  Procedure(s) Performed: OPEN REDUCTION INTERNAL FIXATION (ORIF) FOOT LISFRANC FRACTURE (Left: Foot)  Patient Location: PACU  Anesthesia Type:General  Level of Consciousness: awake, alert , and oriented  Airway & Oxygen Therapy: Patient Spontanous Breathing and Patient connected to face mask oxygen  Post-op Assessment: Report given to RN and Post -op Vital signs reviewed and stable  Post vital signs: Reviewed and stable  Last Vitals:  Vitals Value Taken Time  BP 140/76 07/08/23 0927  Temp    Pulse 119 07/08/23 0928  Resp 23 07/08/23 0928  SpO2 100 % 07/08/23 0928  Vitals shown include unfiled device data.  Last Pain:  Vitals:   07/08/23 0712  PainSc: 0-No pain         Complications: No notable events documented.

## 2023-07-09 ENCOUNTER — Encounter: Payer: Self-pay | Admitting: Podiatry

## 2023-07-16 ENCOUNTER — Ambulatory Visit (INDEPENDENT_AMBULATORY_CARE_PROVIDER_SITE_OTHER): Payer: Medicaid Other

## 2023-07-16 ENCOUNTER — Telehealth: Payer: Self-pay | Admitting: Podiatry

## 2023-07-16 ENCOUNTER — Ambulatory Visit (INDEPENDENT_AMBULATORY_CARE_PROVIDER_SITE_OTHER): Payer: Medicaid Other | Admitting: Podiatry

## 2023-07-16 ENCOUNTER — Encounter: Payer: Self-pay | Admitting: Podiatry

## 2023-07-16 DIAGNOSIS — Z9889 Other specified postprocedural states: Secondary | ICD-10-CM

## 2023-07-16 DIAGNOSIS — S93325D Dislocation of tarsometatarsal joint of left foot, subsequent encounter: Secondary | ICD-10-CM | POA: Diagnosis not present

## 2023-07-16 DIAGNOSIS — S93325S Dislocation of tarsometatarsal joint of left foot, sequela: Secondary | ICD-10-CM

## 2023-07-16 NOTE — Telephone Encounter (Signed)
 Patient was seen today 07/16/23 and forgot to ask if she can get infusion on 07/30/23.

## 2023-07-16 NOTE — Progress Notes (Signed)
   Chief Complaint  Patient presents with   Routine Post Op    POV #1 DOS 07/08/2023 ORIF LT FOOT LIS FRANC FX DISLOCATION It's alright, a little swollen from having it down.  I don't have any pain.    Subjective:  Patient presents today status post ORIF Lisfranc fracture dislocation of the left foot.  DOS: 07/08/2023.  Patient doing well.  NWB in the cam boot with a knee scooter.  No new complaints  Past Medical History:  Diagnosis Date   Acute sinusitis    Asthma    GERD (gastroesophageal reflux disease)    History of kidney stones    Hives    Hypothyroidism    IBS (irritable bowel syndrome)    RLS (restless legs syndrome)    Tachycardia     Past Surgical History:  Procedure Laterality Date   BIOPSY  06/01/2023   Procedure: BIOPSY;  Surgeon: Maryruth Ole DASEN, MD;  Location: [facility];  Service: Endoscopy;;   CHOLECYSTECTOMY     COLONOSCOPY WITH PROPOFOL  N/A 05/12/2022   Procedure: COLONOSCOPY WITH PROPOFOL ;  Surgeon: Maryruth Ole DASEN, MD;  Location: [facility];  Service: Endoscopy;  Laterality: N/A;   COLONOSCOPY WITH PROPOFOL  N/A 06/01/2023   Procedure: COLONOSCOPY WITH PROPOFOL ;  Surgeon: Maryruth Ole DASEN, MD;  Location: [facility];  Service: Endoscopy;  Laterality: N/A;   ESOPHAGOGASTRODUODENOSCOPY (EGD) WITH PROPOFOL  N/A 05/12/2022   Procedure: ESOPHAGOGASTRODUODENOSCOPY (EGD) WITH PROPOFOL ;  Surgeon: Maryruth Ole DASEN, MD;  Location: [facility];  Service: Endoscopy;  Laterality: N/A;   OPEN REDUCTION INTERNAL FIXATION (ORIF) FOOT LISFRANC FRACTURE Left 07/08/2023   Procedure: OPEN REDUCTION INTERNAL FIXATION (ORIF) FOOT LISFRANC FRACTURE;  Surgeon: Janit Thresa HERO, DPM;  Location: [facility];  Service: Orthopedics/Podiatry;  Laterality: Left;   TONSILLECTOMY     adnoids    No Known Allergies  Objective/Physical Exam Neurovascular status intact.  Incision well coapted with sutures intact. No sign of infectious process noted. No dehiscence.  No active bleeding noted.  Moderate edema noted to the foot  Radiographic Exam LT foot 07/16/2023:  Reduction of the Lisfranc fracture dislocation with orthopedic screw fixation which appears stable and intact.  Assessment: 1. s/p ORIF Lisfranc fracture dislocation LT foot.. DOS: 07/08/2023   Plan of Care:  -Patient was evaluated. X-rays reviewed - Continue NWB in the cam boot with a knee scooter -Patient may begin washing and showering and getting the foot wet -Return to clinic 1 week suture removal   Thresa EMERSON Janit, DPM [facility]  Dr. Thresa EMERSON Janit, DPM    [address] [place], [place] [address]                Office [phone]  Fax [phone]

## 2023-07-17 ENCOUNTER — Encounter: Payer: Self-pay | Admitting: Podiatry

## 2023-07-21 ENCOUNTER — Other Ambulatory Visit: Payer: Self-pay | Admitting: Podiatry

## 2023-07-21 MED ORDER — HYDROXYZINE PAMOATE 25 MG PO CAPS: 25.0000 mg | ORAL_CAPSULE | Freq: Three times a day (TID) | ORAL | 0 refills | Status: AC | PRN

## 2023-07-21 NOTE — Progress Notes (Signed)
 PRN allergic reaction on leg.

## 2023-07-23 ENCOUNTER — Ambulatory Visit (INDEPENDENT_AMBULATORY_CARE_PROVIDER_SITE_OTHER): Payer: Medicaid Other | Admitting: Podiatry

## 2023-07-23 ENCOUNTER — Encounter: Payer: Self-pay | Admitting: Podiatry

## 2023-07-23 VITALS — Ht 65.0 in | Wt 260.0 lb

## 2023-07-23 DIAGNOSIS — S93325A Dislocation of tarsometatarsal joint of left foot, initial encounter: Secondary | ICD-10-CM

## 2023-07-23 NOTE — Progress Notes (Signed)
   Chief Complaint  Patient presents with   Routine Post Op    Pt is here for routine post op visit states she has no pain at all. Continues to wear cam boot.    Subjective:  Patient presents today status post ORIF Lisfranc fracture dislocation of the left foot.  DOS: 07/08/2023.  Patient developed a skin rash to the left lower extremity just below the knee encompassing the leg.  Atarax  was prescribed and the rash has since resolved.  Past Medical History:  Diagnosis Date   Acute sinusitis    Asthma    GERD (gastroesophageal reflux disease)    History of kidney stones    Hives    Hypothyroidism    IBS (irritable bowel syndrome)    RLS (restless legs syndrome)    Tachycardia     Past Surgical History:  Procedure Laterality Date   BIOPSY  06/01/2023   Procedure: BIOPSY;  Surgeon: Maryruth Ole DASEN, MD;  Location: [facility];  Service: Endoscopy;;   CHOLECYSTECTOMY     COLONOSCOPY WITH PROPOFOL  N/A 05/12/2022   Procedure: COLONOSCOPY WITH PROPOFOL ;  Surgeon: Maryruth Ole DASEN, MD;  Location: [facility];  Service: Endoscopy;  Laterality: N/A;   COLONOSCOPY WITH PROPOFOL  N/A 06/01/2023   Procedure: COLONOSCOPY WITH PROPOFOL ;  Surgeon: Maryruth Ole DASEN, MD;  Location: [facility];  Service: Endoscopy;  Laterality: N/A;   ESOPHAGOGASTRODUODENOSCOPY (EGD) WITH PROPOFOL  N/A 05/12/2022   Procedure: ESOPHAGOGASTRODUODENOSCOPY (EGD) WITH PROPOFOL ;  Surgeon: Maryruth Ole DASEN, MD;  Location: [facility];  Service: Endoscopy;  Laterality: N/A;   OPEN REDUCTION INTERNAL FIXATION (ORIF) FOOT LISFRANC FRACTURE Left 07/08/2023   Procedure: OPEN REDUCTION INTERNAL FIXATION (ORIF) FOOT LISFRANC FRACTURE;  Surgeon: Janit Thresa HERO, DPM;  Location: [facility];  Service: Orthopedics/Podiatry;  Laterality: Left;   TONSILLECTOMY     adnoids    No Known Allergies  Objective/Physical Exam Neurovascular status intact.  Incision well coapted with sutures intact. No sign of  infectious process noted. No dehiscence. No active bleeding noted.  Mild edema noted to the foot  Radiographic Exam LT foot 07/16/2023:  Reduction of the Lisfranc fracture dislocation with orthopedic screw fixation which appears stable and intact.  Assessment: 1. s/p ORIF Lisfranc fracture dislocation LT foot.. DOS: 07/08/2023 2.  Allergic skin reaction to the left leg; resolved  Plan of Care:  -Patient was evaluated. -Suture removed -Allergic skin reaction is resolved -Continue NWB in the cam boot with a knee scooter -Return to clinic 3 weeks follow-up x-ray and to initiate weightbearing   Thresa EMERSON Janit, DPM [facility]  Dr. Thresa EMERSON Janit, DPM    [address] [place], [place] [address]                Office [phone]  Fax [phone]

## 2023-08-06 ENCOUNTER — Encounter: Payer: Self-pay | Admitting: Podiatry

## 2023-08-06 ENCOUNTER — Ambulatory Visit (INDEPENDENT_AMBULATORY_CARE_PROVIDER_SITE_OTHER): Payer: Medicaid Other | Admitting: Podiatry

## 2023-08-06 ENCOUNTER — Ambulatory Visit (INDEPENDENT_AMBULATORY_CARE_PROVIDER_SITE_OTHER): Payer: Medicaid Other

## 2023-08-06 DIAGNOSIS — Z9889 Other specified postprocedural states: Secondary | ICD-10-CM

## 2023-08-06 DIAGNOSIS — S93325D Dislocation of tarsometatarsal joint of left foot, subsequent encounter: Secondary | ICD-10-CM | POA: Diagnosis not present

## 2023-08-06 NOTE — Progress Notes (Signed)
   Chief Complaint  Patient presents with   Routine Post Op    POV #3 DOS 07/08/2023 ORIF LT FOOT LIS FRANC FX DISLOCATION "It feels better, it's just dry.  I have a zero pain level."    Subjective:  Patient presents today status post ORIF Lisfranc fracture dislocation of the left foot.  DOS: 07/08/2023.  Patient doing well.  No pain.  She is currently NWB in the cam boot with a knee scooter.  No new complaints  Past Medical History:  Diagnosis Date   Acute sinusitis    Asthma    GERD (gastroesophageal reflux disease)    History of kidney stones    Hives    Hypothyroidism    IBS (irritable bowel syndrome)    RLS (restless legs syndrome)    Tachycardia     Past Surgical History:  Procedure Laterality Date   BIOPSY  06/01/2023   Procedure: BIOPSY;  Surgeon: Regis Bill, MD;  Location: [facility];  Service: Endoscopy;;   CHOLECYSTECTOMY     COLONOSCOPY WITH PROPOFOL N/A 05/12/2022   Procedure: COLONOSCOPY WITH PROPOFOL;  Surgeon: Regis Bill, MD;  Location: [facility];  Service: Endoscopy;  Laterality: N/A;   COLONOSCOPY WITH PROPOFOL N/A 06/01/2023   Procedure: COLONOSCOPY WITH PROPOFOL;  Surgeon: Regis Bill, MD;  Location: [facility];  Service: Endoscopy;  Laterality: N/A;   ESOPHAGOGASTRODUODENOSCOPY (EGD) WITH PROPOFOL N/A 05/12/2022   Procedure: ESOPHAGOGASTRODUODENOSCOPY (EGD) WITH PROPOFOL;  Surgeon: Regis Bill, MD;  Location: [facility];  Service: Endoscopy;  Laterality: N/A;   OPEN REDUCTION INTERNAL FIXATION (ORIF) FOOT LISFRANC FRACTURE Left 07/08/2023   Procedure: OPEN REDUCTION INTERNAL FIXATION (ORIF) FOOT LISFRANC FRACTURE;  Surgeon: Felecia Shelling, DPM;  Location: [facility];  Service: Orthopedics/Podiatry;  Laterality: Left;   TONSILLECTOMY     adnoids    No Known Allergies  Objective/Physical Exam Neurovascular status intact.  Incision nicely healed.  There continues to be chronic mild edema to the foot.  No  tenderness with palpation  Radiographic Exam LT foot 08/06/2023:  Unchanged.  Reduction of the Lisfranc fracture dislocation with orthopedic screw fixation which appears stable and intact.  Assessment: 1. s/p ORIF Lisfranc fracture dislocation LT foot.. DOS: 07/08/2023  Plan of Care:  -Patient was evaluated. - Patient may begin WBAT in the cam boot.  Discontinue knee scooter -After weightbearing in the cam boot for about 3 weeks she may begin to also transition out of the cam boot into good supportive tennis shoes -Return to clinic 6 weeks  Felecia Shelling, DPM [facility]  Dr. Felecia Shelling, DPM    [address] [place], [place] [address]                Office [phone]  Fax [phone]

## 2023-08-26 ENCOUNTER — Encounter: Payer: Self-pay | Admitting: Podiatry

## 2023-08-29 ENCOUNTER — Other Ambulatory Visit: Payer: Self-pay | Admitting: Podiatry

## 2023-08-29 MED ORDER — IBUPROFEN 800 MG PO TABS: 800.0000 mg | ORAL_TABLET | Freq: Three times a day (TID) | ORAL | 1 refills | Status: AC

## 2023-08-29 MED ORDER — HYDROCODONE-ACETAMINOPHEN 5-325 MG PO TABS: 1.0000 | ORAL_TABLET | Freq: Four times a day (QID) | ORAL | 0 refills | Status: AC | PRN

## 2023-09-17 ENCOUNTER — Ambulatory Visit

## 2023-09-17 ENCOUNTER — Ambulatory Visit: Payer: Medicaid Other | Admitting: Podiatry

## 2023-09-17 ENCOUNTER — Encounter: Payer: Self-pay | Admitting: Podiatry

## 2023-09-17 ENCOUNTER — Ambulatory Visit (INDEPENDENT_AMBULATORY_CARE_PROVIDER_SITE_OTHER)

## 2023-09-17 DIAGNOSIS — M89069 Algoneurodystrophy, unspecified lower leg: Secondary | ICD-10-CM

## 2023-09-17 DIAGNOSIS — S93325D Dislocation of tarsometatarsal joint of left foot, subsequent encounter: Secondary | ICD-10-CM | POA: Diagnosis not present

## 2023-09-17 NOTE — Progress Notes (Signed)
 Chief Complaint  Patient presents with   Routine Post Op     DOS 07/08/2023 ORIF LT FOOT LIS FRANC FX DISLOCATION      Subjective:  Patient presents today status post ORIF Lisfranc fracture dislocation of the left foot.  DOS: 07/08/2023.  Patient states that she continues to improve.  WBAT in tennis shoes.  She occasionally uses a crutch for assistance.  Past Medical History:  Diagnosis Date   Acute sinusitis    Asthma    GERD (gastroesophageal reflux disease)    History of kidney stones    Hives    Hypothyroidism    IBS (irritable bowel syndrome)    RLS (restless legs syndrome)    Tachycardia     Past Surgical History:  Procedure Laterality Date   BIOPSY  06/01/2023   Procedure: BIOPSY;  Surgeon: Regis Bill, MD;  Location: [facility];  Service: Endoscopy;;   CHOLECYSTECTOMY     COLONOSCOPY WITH PROPOFOL N/A 05/12/2022   Procedure: COLONOSCOPY WITH PROPOFOL;  Surgeon: Regis Bill, MD;  Location: [facility];  Service: Endoscopy;  Laterality: N/A;   COLONOSCOPY WITH PROPOFOL N/A 06/01/2023   Procedure: COLONOSCOPY WITH PROPOFOL;  Surgeon: Regis Bill, MD;  Location: [facility];  Service: Endoscopy;  Laterality: N/A;   ESOPHAGOGASTRODUODENOSCOPY (EGD) WITH PROPOFOL N/A 05/12/2022   Procedure: ESOPHAGOGASTRODUODENOSCOPY (EGD) WITH PROPOFOL;  Surgeon: Regis Bill, MD;  Location: [facility];  Service: Endoscopy;  Laterality: N/A;   OPEN REDUCTION INTERNAL FIXATION (ORIF) FOOT LISFRANC FRACTURE Left 07/08/2023   Procedure: OPEN REDUCTION INTERNAL FIXATION (ORIF) FOOT LISFRANC FRACTURE;  Surgeon: Felecia Shelling, DPM;  Location: [facility];  Service: Orthopedics/Podiatry;  Laterality: Left;   TONSILLECTOMY     adnoids    No Known Allergies  Objective/Physical Exam Neurovascular status intact.  Incision nicely healed.  Skin is warm to touch.  No edema.  No tenderness with palpation.  Muscle strength 5/5 all  compartments  Radiographic Exam LT foot 09/17/2023 Mottled appearance noted diffusely throughout the bones of the foot.  Concerning for possible onset of Sudeck's atrophy.  No acute fracture identified.  Orthopedic screws intact  Assessment: 1. s/p ORIF Lisfranc fracture dislocation LT foot.. DOS: 07/08/2023 2.  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 -X-rays were reviewed in detail with the patient.  Although there is concern of mottled appearance diffusely throughout the bones of the foot patient states that she believes she is progressing nicely.  Recommend slowly increasing activity in tennis shoes.  We discussed stationary bike as well as treadmill.  She may essentially be full activity as tolerated. -Prescription for physical therapy at [facility] was provided for the patient.  Patient states that if she is not getting the progression that she would like she will contact physical therapy for an appointment. -Discussed in detail the pathology and etiology of Sudeck atrophy and CRPS.  Clinically the patient does not present with any CRPS type pathology although radiographically there is concern -Will plan to simply observe for now. -Recommend range of motion and resistance training which was demonstrated today.  Patient also has resistance bands for strengthening the foot and ankle. -Return to clinic 6 weeks follow-up x-ray  Felecia Shelling, DPM [facility]  Dr. Felecia Shelling, DPM    [address]  [place], [place] [address]                Office [phone]  Fax [phone]

## 2023-09-24 ENCOUNTER — Encounter: Payer: Self-pay | Admitting: Podiatry

## 2023-11-02 ENCOUNTER — Ambulatory Visit: Admitting: Podiatry

## 2023-11-02 ENCOUNTER — Encounter: Payer: Self-pay | Admitting: Podiatry

## 2023-11-02 ENCOUNTER — Ambulatory Visit (INDEPENDENT_AMBULATORY_CARE_PROVIDER_SITE_OTHER)

## 2023-11-02 DIAGNOSIS — S93325D Dislocation of tarsometatarsal joint of left foot, subsequent encounter: Secondary | ICD-10-CM

## 2023-11-02 NOTE — Progress Notes (Signed)
   No chief complaint on file.   Subjective:  Patient presents today status post ORIF Lisfranc fracture dislocation of the left foot.  DOS: 07/08/2023.  Patient has noticed significant improvement since last visit.  She is now walking in tennis shoes without any pain.  She also occasionally walks barefoot without pain.  She feels that she is not limited and overall is very satisfied  Past Medical History:  Diagnosis Date   Acute sinusitis    Asthma    GERD (gastroesophageal reflux disease)    History of kidney stones    Hives    Hypothyroidism    IBS (irritable bowel syndrome)    RLS (restless legs syndrome)    Tachycardia     Past Surgical History:  Procedure Laterality Date   BIOPSY  06/01/2023   Procedure: BIOPSY;  Surgeon: Shane Darling, MD;  Location: [facility];  Service: Endoscopy;;   CHOLECYSTECTOMY     COLONOSCOPY WITH PROPOFOL  N/A 05/12/2022   Procedure: COLONOSCOPY WITH PROPOFOL ;  Surgeon: Shane Darling, MD;  Location: [facility];  Service: Endoscopy;  Laterality: N/A;   COLONOSCOPY WITH PROPOFOL  N/A 06/01/2023   Procedure: COLONOSCOPY WITH PROPOFOL ;  Surgeon: Shane Darling, MD;  Location: [facility];  Service: Endoscopy;  Laterality: N/A;   ESOPHAGOGASTRODUODENOSCOPY (EGD) WITH PROPOFOL  N/A 05/12/2022   Procedure: ESOPHAGOGASTRODUODENOSCOPY (EGD) WITH PROPOFOL ;  Surgeon: Shane Darling, MD;  Location: [facility];  Service: Endoscopy;  Laterality: N/A;   OPEN REDUCTION INTERNAL FIXATION (ORIF) FOOT LISFRANC FRACTURE Left 07/08/2023   Procedure: OPEN REDUCTION INTERNAL FIXATION (ORIF) FOOT LISFRANC FRACTURE;  Surgeon: Dot Gazella, DPM;  Location: [facility];  Service: Orthopedics/Podiatry;  Laterality: Left;   TONSILLECTOMY     adnoids    No Known Allergies  Objective/Physical Exam Neurovascular status intact.  Incision nicely healed.  Skin is warm to touch.  No edema.  No tenderness with palpation.  Muscle strength 5/5 all  compartments  Radiographic Exam LT foot 09/17/2023 Improved appearance of the osseous structures of the foot. No acute fracture identified.  Orthopedic screws intact  Assessment: 1. s/p ORIF Lisfranc fracture dislocation LT foot.. DOS: 07/08/2023   Plan of Care:  -Patient was evaluated. - Overall the patient is doing very well.  She may now resume full activity no restrictions -Recommend good supportive tennis shoes and sneakers -Return to clinic as needed  Dot Gazella, DPM [facility]  Dr. Dot Gazella, DPM    [address] [place], [place] [address]                Office [phone]  Fax [phone]
